# Patient Record
Sex: Female | Born: 1965 | Race: Black or African American | Hispanic: No | Marital: Single | Smoking: Former smoker
Health system: Southern US, Community
[De-identification: ages and names within clinical notes are randomized; demographics above are authoritative.]

## PROBLEM LIST (undated history)

## (undated) DIAGNOSIS — J45909 Unspecified asthma, uncomplicated: Secondary | ICD-10-CM

## (undated) DIAGNOSIS — G5603 Carpal tunnel syndrome, bilateral upper limbs: Secondary | ICD-10-CM

## (undated) HISTORY — DX: Carpal tunnel syndrome, bilateral upper limbs: G56.03

## (undated) HISTORY — PX: TUBAL LIGATION: SHX77

## (undated) HISTORY — DX: Unspecified asthma, uncomplicated: J45.909

## (undated) HISTORY — PX: NASAL FRACTURE SURGERY: SHX718

---

## 2001-08-03 ENCOUNTER — Emergency Department (HOSPITAL_COMMUNITY): Admission: EM | Admit: 2001-08-03 | Discharge: 2001-08-03 | Payer: Self-pay | Admitting: Emergency Medicine

## 2003-11-21 ENCOUNTER — Emergency Department (HOSPITAL_COMMUNITY): Admission: EM | Admit: 2003-11-21 | Discharge: 2003-11-21 | Payer: Self-pay | Admitting: Emergency Medicine

## 2007-03-17 ENCOUNTER — Emergency Department (HOSPITAL_COMMUNITY): Admission: EM | Admit: 2007-03-17 | Discharge: 2007-03-17 | Payer: Self-pay | Admitting: Emergency Medicine

## 2007-07-22 ENCOUNTER — Ambulatory Visit: Payer: Self-pay | Admitting: Internal Medicine

## 2007-08-27 ENCOUNTER — Ambulatory Visit: Payer: Self-pay | Admitting: Family Medicine

## 2007-08-27 LAB — CONVERTED CEMR LAB
ALT: 18 units/L (ref 0–35)
AST: 15 units/L (ref 0–37)
Albumin: 4.1 g/dL (ref 3.5–5.2)
Alkaline Phosphatase: 41 units/L (ref 39–117)
BUN: 9 mg/dL (ref 6–23)
Basophils Absolute: 0 10*3/uL (ref 0.0–0.1)
Basophils Relative: 0 % (ref 0–1)
CO2: 21 meq/L (ref 19–32)
Calcium: 9.1 mg/dL (ref 8.4–10.5)
Chloride: 107 meq/L (ref 96–112)
Cholesterol: 204 mg/dL — ABNORMAL HIGH (ref 0–200)
Creatinine, Ser: 0.81 mg/dL (ref 0.40–1.20)
Eosinophils Absolute: 0.2 10*3/uL (ref 0.0–0.7)
Eosinophils Relative: 4 % (ref 0–5)
Glucose, Bld: 98 mg/dL (ref 70–99)
HCT: 39.5 % (ref 36.0–46.0)
HDL: 43 mg/dL (ref >39)
Hemoglobin: 13.3 g/dL (ref 12.0–15.0)
LDL Cholesterol: 139 mg/dL — ABNORMAL HIGH (ref 0–99)
Lymphocytes Relative: 42 % (ref 12–46)
Lymphs Abs: 2.6 10*3/uL (ref 0.7–4.0)
MCHC: 33.7 g/dL (ref 30.0–36.0)
MCV: 94 fL (ref 78.0–100.0)
Monocytes Absolute: 0.6 10*3/uL (ref 0.1–1.0)
Monocytes Relative: 10 % (ref 3–12)
Neutro Abs: 2.6 10*3/uL (ref 1.7–7.7)
Neutrophils Relative %: 44 % (ref 43–77)
Platelets: 348 10*3/uL (ref 150–400)
Potassium: 4.3 meq/L (ref 3.5–5.3)
RBC: 4.2 M/uL (ref 3.87–5.11)
RDW: 12.9 % (ref 11.5–15.5)
Sodium: 138 meq/L (ref 135–145)
TSH: 1.126 microintl units/mL (ref 0.350–4.50)
Total Bilirubin: 0.4 mg/dL (ref 0.3–1.2)
Total CHOL/HDL Ratio: 4.7
Total Protein: 7 g/dL (ref 6.0–8.3)
Triglycerides: 110 mg/dL (ref <150)
VLDL: 22 mg/dL (ref 0–40)
WBC: 6 10*3/uL (ref 4.0–10.5)

## 2007-09-02 ENCOUNTER — Encounter: Payer: Self-pay | Admitting: Family Medicine

## 2007-09-02 ENCOUNTER — Ambulatory Visit: Payer: Self-pay | Admitting: Family Medicine

## 2007-09-02 LAB — CONVERTED CEMR LAB
Chlamydia, DNA Probe: NEGATIVE
GC Probe Amp, Genital: NEGATIVE

## 2007-09-03 ENCOUNTER — Ambulatory Visit: Payer: Self-pay | Admitting: *Deleted

## 2007-09-05 ENCOUNTER — Ambulatory Visit (HOSPITAL_COMMUNITY): Admission: RE | Admit: 2007-09-05 | Discharge: 2007-09-05 | Payer: Self-pay | Admitting: Family Medicine

## 2007-12-03 ENCOUNTER — Ambulatory Visit: Payer: Self-pay | Admitting: Family Medicine

## 2011-06-08 ENCOUNTER — Ambulatory Visit: Payer: Self-pay | Admitting: Family Medicine

## 2011-06-08 VITALS — BP 127/84 | HR 75 | Temp 98.3°F | Resp 16 | Ht 64.0 in | Wt 207.0 lb

## 2011-06-08 DIAGNOSIS — B9789 Other viral agents as the cause of diseases classified elsewhere: Secondary | ICD-10-CM

## 2011-06-08 DIAGNOSIS — R42 Dizziness and giddiness: Secondary | ICD-10-CM

## 2011-06-08 DIAGNOSIS — R1110 Vomiting, unspecified: Secondary | ICD-10-CM

## 2011-06-08 DIAGNOSIS — B349 Viral infection, unspecified: Secondary | ICD-10-CM

## 2011-06-08 LAB — POCT CBC
Granulocyte percent: 50.2 %G (ref 37–80)
HCT, POC: 40 % (ref 37.7–47.9)
Hemoglobin: 12.8 g/dL (ref 12.2–16.2)
Lymph, poc: 2.8 (ref 0.6–3.4)
MCH, POC: 30.3 pg (ref 27–31.2)
MCHC: 32 g/dL (ref 31.8–35.4)
MCV: 93.8 fL (ref 80–97)
MID (cbc): 0.6 (ref 0–0.9)
MPV: 7.5 fL (ref 0–99.8)
POC Granulocyte: 3.4 (ref 2–6.9)
POC LYMPH PERCENT: 41.5 %L (ref 10–50)
POC MID %: 8.3 %M (ref 0–12)
Platelet Count, POC: 378 10*3/uL (ref 142–424)
RBC: 4.26 M/uL (ref 4.04–5.48)
RDW, POC: 13.3 %
WBC: 6.8 10*3/uL (ref 4.6–10.2)

## 2011-06-08 MED ORDER — ONDANSETRON 4 MG PO TBDP: 4.0000 mg | ORAL_TABLET | Freq: Three times a day (TID) | ORAL | Status: AC | PRN

## 2011-06-08 NOTE — Progress Notes (Signed)
Subjective: 46 year old Afro-American female with a history of having been under the weather for the past 4 days. It started with a cough and head congestion. She got sore throat. Today she is nauseated and vomited once. She had a loose stool. She feels full, congested, and dizzy centered in her facial area. He generally is a healthy person. She is now on a regular medications. She's allergic to any medications. She has not had any major cardiac disease.  Objective: Pleasant female alert and oriented but looks like she doesn't feel very energetic today. Her TMs are normal. Nose is red and more indolent right nares than the left. Neck was supple without nodes. Throat clear without erythema. Chest clear to auscultation. Heart regular without any murmurs. Abdomen was soft without any masses. Does have a little epigastric tenderness.  Assessment: Generalized malaise with nausea, vomiting, dizziness, congestion  Plan:  Check CBC  Results for orders placed in visit on 06/08/11  POCT CBC      Component Value Range   WBC 6.8  4.6 - 10.2 (K/uL)   Lymph, poc 2.8  0.6 - 3.4    POC LYMPH PERCENT 41.5  10 - 50 (%L)   MID (cbc) 0.6  0 - 0.9    POC MID % 8.3  0 - 12 (%M)   POC Granulocyte 3.4  2 - 6.9    Granulocyte percent 50.2  37 - 80 (%G)   RBC 4.26  4.04 - 5.48 (M/uL)   Hemoglobin 12.8  12.2 - 16.2 (g/dL)   HCT, POC 14.7  82.9 - 47.9 (%)   MCV 93.8  80 - 97 (fL)   MCH, POC 30.3  27 - 31.2 (pg)   MCHC 32.0  31.8 - 35.4 (g/dL)   RDW, POC 56.2     Platelet Count, POC 378  142 - 424 (K/uL)   MPV 7.5  0 - 99.8 (fL)   Viral syndrome. Probably we could treat her symptomatically. She did not want any medicine but I'm going to have given her a prescription for Zofran to have on hand in case she needs it. Return if worse. Stay off work through tomorrow

## 2011-06-08 NOTE — Patient Instructions (Signed)
Fluids, rest, medicine if needed for nausea, return as necessary.

## 2013-03-13 ENCOUNTER — Encounter (HOSPITAL_COMMUNITY): Payer: Self-pay | Admitting: Emergency Medicine

## 2013-03-13 ENCOUNTER — Emergency Department (HOSPITAL_COMMUNITY)
Admission: EM | Admit: 2013-03-13 | Discharge: 2013-03-14 | Disposition: A | Discharge status: Home / Self Care | Payer: Self-pay | Attending: Emergency Medicine | Admitting: Emergency Medicine

## 2013-03-13 DIAGNOSIS — Z23 Encounter for immunization: Secondary | ICD-10-CM | POA: Insufficient documentation

## 2013-03-13 DIAGNOSIS — N751 Abscess of Bartholin's gland: Secondary | ICD-10-CM | POA: Insufficient documentation

## 2013-03-13 DIAGNOSIS — F17200 Nicotine dependence, unspecified, uncomplicated: Secondary | ICD-10-CM | POA: Insufficient documentation

## 2013-03-13 NOTE — ED Notes (Signed)
Pt. reports abscess at right vagina with no drainage onset yesterday .

## 2013-03-14 MED ORDER — TRAMADOL HCL 50 MG PO TABS: 50.0000 mg | ORAL_TABLET | Freq: Four times a day (QID) | ORAL | Status: DC | PRN

## 2013-03-14 MED ORDER — TETANUS-DIPHTH-ACELL PERTUSSIS 5-2.5-18.5 LF-MCG/0.5 IM SUSP
0.5000 mL | Freq: Once | INTRAMUSCULAR | Status: AC
  Administered 2013-03-14: 0.5 mL via INTRAMUSCULAR
  Filled 2013-03-14: qty 0.5

## 2013-03-14 NOTE — ED Notes (Signed)
Patient is resting comfortably. 

## 2013-03-14 NOTE — ED Provider Notes (Signed)
   INCISION AND DRAINAGE Date/Time: 03/14/2013 1:07 AM Performed by: Abigail Butts Authorized by: Abigail Butts Consent: Verbal consent obtained. Risks and benefits: risks, benefits and alternatives were discussed Consent given by: patient Patient understanding: patient states understanding of the procedure being performed Patient consent: the patient's understanding of the procedure matches consent given Procedure consent: procedure consent matches procedure scheduled Relevant documents: relevant documents present and verified Site marked: the operative site was marked Required items: required blood products, implants, devices, and special equipment available Patient identity confirmed: verbally with patient and arm band Time out: Immediately prior to procedure a "time out" was called to verify the correct patient, procedure, equipment, support staff and site/side marked as required. Type: abscess Body area: anogenital Location details: Bartholin's gland Anesthesia: local infiltration Local anesthetic: lidocaine 2% without epinephrine Anesthetic total: 3 ml Patient sedated: no Scalpel size: 11 Incision type: single straight Complexity: simple Drainage: purulent Drainage amount: copious Wound treatment: wound left open Packing material: 1/4 in iodoform gauze Patient tolerance: Patient tolerated the procedure well with no immediate complications.   Cristina Soho Dunbar Buras, PA-C 03/14/13 [phone]

## 2013-03-14 NOTE — ED Provider Notes (Signed)
CSN: [id number]     Arrival date & time 03/13/13  2123 History   First MD Initiated Contact with Patient 03/14/13 0006     Chief Complaint  Patient presents with  . Abscess   (Consider location/radiation/quality/duration/timing/severity/associated sxs/prior Treatment) HPI Is a generally healthy 48 year old man who presents with complaint of a "boil" over her right vulva. Noticed first a small bump about 5d ago. Lesion has increased in size. It is tender. Patient has 8/10 pain which is worse with walking or any pressure on the lesion. Pain is nonradiating. She denies fever. Denies systemic symptoms. History of similar symptoms. A history of abscess. Last tetanus is unknown.  History reviewed. No pertinent past medical history. History reviewed. No pertinent past surgical history. No family history on file. History  Substance Use Topics  . Smoking status: Current Every Day Smoker  . Smokeless tobacco: Not on file  . Alcohol Use: No   OB History   Grav Para Term Preterm Abortions TAB SAB Ect Mult Living                 Review of Systems Ten point review of symptoms performed and is negative with the exception of symptoms noted above.   Allergies  Review of patient's allergies indicates no known allergies.  Home Medications   Current Outpatient Rx  Name  Route  Sig  Dispense  Refill  . Ibuprofen-Diphenhydramine Cit (IBUPROFEN PM PO)   Oral   Take 1 tablet by mouth at bedtime as needed (pain).          BP 126/90  Pulse 91  Temp(Src) 98.1 F (36.7 C) (Oral)  Resp 18  Ht 5\' 3"  (1.6 m)  Wt 203 lb (92.08 kg)  BMI 35.97 kg/m2  SpO2 100%  LMP 03/06/2013 Physical Exam Gen: well developed and well nourished appearing Head: NCAT Eyes: PERL, EOMI Nose: no epistaixis or rhinorrhea Mouth/throat: mucosa is moist and pink Neck: supple, no stridor Lungs: CTA B, no wheezing, rhonchi or rales CV: RRR, no murmur, extremities appear well perfused.  Abd: soft, notender,  nondistended GU: right sided bartholin's abscess, mild tender right inguinal adenopaty Back: no ttp, no cva ttp Skin: warm and dry Ext: normal to inspection, no dependent edema Neuro: CN ii-xii grossly intact, no focal deficits Psyche; normal affect,  calm and cooperative.   ED Course  Procedures (including critical care time) Labs Review   MDM  Bartholin's abscess. MLP to incise and drain. Plan to d/c home with pain control and outpatient f/u for wound check in 2 days along with return precautions.     Elyn Peers, MD 03/14/13 [phone]

## 2013-03-14 NOTE — ED Provider Notes (Signed)
I directly supervised H. Muthersbaugh, PA-C as she performed this incision and drainage.   Elyn Peers, MD 03/14/13 [phone]

## 2013-03-14 NOTE — ED Notes (Signed)
MD at bedside. 

## 2013-03-14 NOTE — Discharge Instructions (Signed)
Bartholin's Cyst or Abscess Bartholin's glands are small glands located within the folds of skin (labia) along the sides of the lower opening of the vagina (birth canal). A cyst may develop when the duct of the gland becomes blocked. When this happens, fluid that accumulates within the cyst can become infected. This is known as an abscess. The Bartholin gland produces a mucous fluid to lubricate the outside of the vagina during sexual intercourse. SYMPTOMS   Patients with a small cyst may not have any symptoms.  Mild discomfort to severe pain depending on the size of the cyst and if it is infected (abscess).  Pain, redness, and swelling around the lower opening of the vagina.  Painful intercourse.  Pressure in the perineal area.  Swelling of the lips of the vagina (labia).  The cyst or abscess can be on one side or both sides of the vagina. DIAGNOSIS   A large swelling is seen in the lower vagina area by your caregiver.  Painful to touch.  Redness and pain, if it is an abscess. TREATMENT   Sometimes the cyst will go away on its own.  Apply warm wet compresses to the area or take hot sitz baths several times a day.  An incision to drain the cyst or abscess with local anesthesia.  Culture the pus, if it is an abscess.  Antibiotic treatment, if it is an abscess.  Cut open the gland and suture the edges to make the opening of the gland bigger (marsupialization).  Remove the whole gland if the cyst or abscess returns. PREVENTION   Practice good hygiene.  Clean the vaginal area with a mild soap and soft cloth when bathing.  Do not rub hard in the vaginal area when bathing.  Protect the crotch area with a padded cushion if you take long bike rides or ride horses.  Be sure you are well lubricated when you have sexual intercourse. HOME CARE INSTRUCTIONS   If your cyst or abscess was opened, a small piece of gauze, or a drain, may have been placed in the wound to allow  drainage. Do not remove this gauze or drain unless directed by your caregiver.  Wear feminine pads, not tampons, as needed for any drainage or bleeding.  If antibiotics were prescribed, take them exactly as directed. Finish the entire course.  Only take over-the-counter or prescription medicines for pain, discomfort, or fever as directed by your caregiver. SEEK IMMEDIATE MEDICAL CARE IF:   You have an increase in pain, redness, swelling, or drainage.  You have bleeding from the wound which results in the use of more than the number of pads suggested by your caregiver in 24 hours.  You have chills.  You have a fever.  You develop any new problems (symptoms) or aggravation of your existing condition. MAKE SURE YOU:   Understand these instructions.  Will watch your condition.  Will get help right away if you are not doing well or get worse. Document Released: 01/23/2005 Document Revised: 04/17/2011 Document Reviewed: 09/11/2007 Exit[employer] Patient Information 2014 [employer].  

## 2013-03-14 NOTE — ED Notes (Signed)
Pt has hair follicle that has developed into a abscess on her right labia of her vagina

## 2016-07-06 ENCOUNTER — Encounter (HOSPITAL_COMMUNITY): Payer: Self-pay | Admitting: Emergency Medicine

## 2016-07-06 ENCOUNTER — Emergency Department (HOSPITAL_COMMUNITY)
Admission: EM | Admit: 2016-07-06 | Discharge: 2016-07-06 | Disposition: A | Discharge status: Home / Self Care | Payer: Managed Care, Other (non HMO) | Attending: Emergency Medicine | Admitting: Emergency Medicine

## 2016-07-06 ENCOUNTER — Emergency Department (HOSPITAL_COMMUNITY): Payer: Managed Care, Other (non HMO)

## 2016-07-06 DIAGNOSIS — K529 Noninfective gastroenteritis and colitis, unspecified: Secondary | ICD-10-CM | POA: Insufficient documentation

## 2016-07-06 DIAGNOSIS — R1013 Epigastric pain: Secondary | ICD-10-CM | POA: Diagnosis present

## 2016-07-06 DIAGNOSIS — Z7982 Long term (current) use of aspirin: Secondary | ICD-10-CM | POA: Insufficient documentation

## 2016-07-06 DIAGNOSIS — M5412 Radiculopathy, cervical region: Secondary | ICD-10-CM | POA: Diagnosis not present

## 2016-07-06 DIAGNOSIS — F17200 Nicotine dependence, unspecified, uncomplicated: Secondary | ICD-10-CM | POA: Diagnosis not present

## 2016-07-06 DIAGNOSIS — A084 Viral intestinal infection, unspecified: Secondary | ICD-10-CM

## 2016-07-06 LAB — CBC WITH DIFFERENTIAL/PLATELET
Basophils Absolute: 0 10*3/uL (ref 0.0–0.1)
Basophils Relative: 1 %
Eosinophils Absolute: 0.5 10*3/uL (ref 0.0–0.7)
Eosinophils Relative: 9 %
HCT: 38 % (ref 36.0–46.0)
Hemoglobin: 12.9 g/dL (ref 12.0–15.0)
Lymphocytes Relative: 34 %
Lymphs Abs: 1.9 10*3/uL (ref 0.7–4.0)
MCH: 31.3 pg (ref 26.0–34.0)
MCHC: 33.9 g/dL (ref 30.0–36.0)
MCV: 92.2 fL (ref 78.0–100.0)
Monocytes Absolute: 0.4 10*3/uL (ref 0.1–1.0)
Monocytes Relative: 8 %
Neutro Abs: 2.8 10*3/uL (ref 1.7–7.7)
Neutrophils Relative %: 49 %
Platelets: 315 10*3/uL (ref 150–400)
RBC: 4.12 MIL/uL (ref 3.87–5.11)
RDW: 12.7 % (ref 11.5–15.5)
WBC: 5.6 10*3/uL (ref 4.0–10.5)

## 2016-07-06 LAB — HEPATIC FUNCTION PANEL
ALT: 18 U/L (ref 14–54)
AST: 16 U/L (ref 15–41)
Albumin: 3.7 g/dL (ref 3.5–5.0)
Alkaline Phosphatase: 43 U/L (ref 38–126)
BILIRUBIN TOTAL: 0.3 mg/dL (ref 0.3–1.2)
Total Protein: 7.3 g/dL (ref 6.5–8.1)

## 2016-07-06 LAB — BASIC METABOLIC PANEL
Anion gap: 4 — ABNORMAL LOW (ref 5–15)
BUN: 10 mg/dL (ref 6–20)
CO2: 26 mmol/L (ref 22–32)
Calcium: 8.8 mg/dL — ABNORMAL LOW (ref 8.9–10.3)
Chloride: 110 mmol/L (ref 101–111)
Creatinine, Ser: 0.84 mg/dL (ref 0.44–1.00)
GFR calc Af Amer: >60 mL/min (ref >60)
GFR calc non Af Amer: >60 mL/min (ref >60)
Glucose, Bld: 95 mg/dL (ref 65–99)
Potassium: 4.3 mmol/L (ref 3.5–5.1)
Sodium: 140 mmol/L (ref 135–145)

## 2016-07-06 LAB — TROPONIN I
Troponin I: <0.03 ng/mL (ref <0.03)
Troponin I: <0.03 ng/mL (ref <0.03)

## 2016-07-06 LAB — LIPASE, BLOOD: Lipase: 24 U/L (ref 11–51)

## 2016-07-06 MED ORDER — CYCLOBENZAPRINE HCL 5 MG PO TABS: 5.0000 mg | ORAL_TABLET | Freq: Three times a day (TID) | ORAL | 0 refills | Status: DC | PRN

## 2016-07-06 MED ORDER — DIPHENOXYLATE-ATROPINE 2.5-0.025 MG PO TABS
2.0000 | ORAL_TABLET | Freq: Once | ORAL | Status: AC
  Administered 2016-07-06: 2 via ORAL
  Filled 2016-07-06: qty 2

## 2016-07-06 MED ORDER — DIPHENOXYLATE-ATROPINE 2.5-0.025 MG PO TABS: 1.0000 | ORAL_TABLET | Freq: Four times a day (QID) | ORAL | 0 refills | Status: DC | PRN

## 2016-07-06 MED ORDER — IBUPROFEN 400 MG PO TABS: 400.0000 mg | ORAL_TABLET | Freq: Four times a day (QID) | ORAL | 0 refills | Status: DC | PRN

## 2016-07-06 NOTE — ED Provider Notes (Signed)
[facility] DEPT Provider Note   CSN: [id number] Arrival date & time: 07/06/16  0844     History   Chief Complaint Chief Complaint  Patient presents with  . Numbness  . Chest Pain    HPI Cristina Harris is a 51 y.o. female presenting with waves of epigastric pain described as intermittent and sharp, accompanied by diarrhea.  She describes 3 acute waves of pain the first woke her from sleep around 2 am today, the last occurred while at work just prior to presenting here.  She denies radiation of pain and is currently not having the sharp pain described.  With the last episode she became diaphoretic, lightheaded and started having numbness in her left arm radiating down to her long, ring and pinky finger which persists.  She denies headache, focal weakness, difficulty with speech.  She does have a history of neck problems with known bone spurs, denies history of radicular pain.  She denies chest pain, shortness of breath, n/v, dysuria.  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  The history is provided by the patient.    History reviewed. No pertinent past medical history.  There are no active problems to display for this patient.   Past Surgical History:  Procedure Laterality Date  . NASAL FRACTURE SURGERY    . TUBAL LIGATION      OB History    No data available       Home Medications    Prior to Admission medications   Medication Sig Start Date End Date Taking? Authorizing Provider  ASPIRIN-ACETAMINOPHEN PO Take 2 tablets by mouth daily as needed (pain).   Yes [provider]  cyclobenzaprine (FLEXERIL) 5 MG tablet Take 1 tablet (5 mg total) by mouth 3 (three) times daily as needed for muscle spasms. 07/06/16   Evalee Jefferson, PA-C  diphenoxylate-atropine (LOMOTIL) 2.5-0.025 MG tablet Take 1 tablet by mouth 4 (four) times daily as needed for diarrhea or loose stools (or abdominal cramping). 07/06/16   Evalee Jefferson, PA-C    ibuprofen (ADVIL,MOTRIN) 400 MG tablet Take 1 tablet (400 mg total) by mouth every 6 (six) hours as needed. 07/06/16   Evalee Jefferson, PA-C    Family History History reviewed. No pertinent family history.  Social History Social History  Substance Use Topics  . Smoking status: Current Every Day Smoker  . Smokeless tobacco: Never Used  . Alcohol use Yes     Comment: occ     Allergies   Patient has no known allergies.   Review of Systems Review of Systems  Constitutional: Positive for diaphoresis. Negative for fever.  HENT: Negative for congestion and sore throat.   Eyes: Negative.   Respiratory: Negative for chest tightness and shortness of breath.   Cardiovascular: Negative for chest pain.  Gastrointestinal: Positive for abdominal pain and diarrhea. Negative for nausea and vomiting.  Genitourinary: Negative.   Musculoskeletal: Negative for arthralgias, joint swelling and neck pain.  Skin: Negative.  Negative for rash and wound.  Neurological: Positive for light-headedness and numbness. Negative for dizziness, speech difficulty, weakness and headaches.  Psychiatric/Behavioral: Negative.      Physical Exam Updated Vital Signs BP (!) 128/93 (BP Location: Left Arm)   Pulse (!) 59   Temp 98 F (36.7 C) (Oral)   Resp 18   Wt 92.1 kg (203 lb)   LMP 03/06/2013   SpO2 100%   BMI 35.96 kg/m   Physical Exam  Constitutional: She  appears well-developed and well-nourished.  HENT:  Head: Normocephalic and atraumatic.  Eyes: Conjunctivae are normal.  Neck: Normal range of motion.  Cardiovascular: Normal rate, regular rhythm, normal heart sounds and intact distal pulses.   Pulmonary/Chest: Effort normal and breath sounds normal. She has no wheezes.  Abdominal: Soft. Bowel sounds are normal. She exhibits no mass. There is tenderness in the epigastric area. There is no guarding and negative Murphy's sign.  Musculoskeletal: Normal range of motion.  Neurological: She is alert. A  sensory deficit is present. No cranial nerve deficit. Coordination and gait normal.  Reflex Scores:      Bicep reflexes are 2+ on the right side and 2+ on the left side. Reports tingling sensation left arm and hand including long, ring and little finger.  Intact 2 point discrimination.  Equal grip strength and full, flex/ext of wrist and elbow 5/5.   Negative pronator drift. Lower extremities 5/5 strength ext/flex hip knee and ankle. Cranial nerves 3-12 intact.  Skin: Skin is warm and dry.  Psychiatric: She has a normal mood and affect.  Nursing note and vitals reviewed.    ED Treatments / Results  Labs (all labs ordered are listed, but only abnormal results are displayed) Results for orders placed or performed during the hospital encounter of 76/28/31  Basic metabolic panel  Result Value Ref Range   Sodium 140 135 - 145 mmol/L   Potassium 4.3 3.5 - 5.1 mmol/L   Chloride 110 101 - 111 mmol/L   CO2 26 22 - 32 mmol/L   Glucose, Bld 95 65 - 99 mg/dL   BUN 10 6 - 20 mg/dL   Creatinine, Ser 0.84 0.44 - 1.00 mg/dL   Calcium 8.8 (L) 8.9 - 10.3 mg/dL   GFR calc non Af Amer >60 >60 mL/min   GFR calc Af Amer >60 >60 mL/min   Anion gap 4 (L) 5 - 15  CBC with Differential  Result Value Ref Range   WBC 5.6 4.0 - 10.5 K/uL   RBC 4.12 3.87 - 5.11 MIL/uL   Hemoglobin 12.9 12.0 - 15.0 g/dL   HCT 38.0 36.0 - 46.0 %   MCV 92.2 78.0 - 100.0 fL   MCH 31.3 26.0 - 34.0 pg   MCHC 33.9 30.0 - 36.0 g/dL   RDW 12.7 11.5 - 15.5 %   Platelets 315 150 - 400 K/uL   Neutrophils Relative % 49 %   Neutro Abs 2.8 1.7 - 7.7 K/uL   Lymphocytes Relative 34 %   Lymphs Abs 1.9 0.7 - 4.0 K/uL   Monocytes Relative 8 %   Monocytes Absolute 0.4 0.1 - 1.0 K/uL   Eosinophils Relative 9 %   Eosinophils Absolute 0.5 0.0 - 0.7 K/uL   Basophils Relative 1 %   Basophils Absolute 0.0 0.0 - 0.1 K/uL  Troponin I  Result Value Ref Range   Troponin I <0.03 <0.03 ng/mL  Hepatic function panel  Result Value Ref Range    Total Protein 7.3 6.5 - 8.1 g/dL   Albumin 3.7 3.5 - 5.0 g/dL   AST 16 15 - 41 U/L   ALT 18 14 - 54 U/L   Alkaline Phosphatase 43 38 - 126 U/L   Total Bilirubin 0.3 0.3 - 1.2 mg/dL   Bilirubin, Direct <0.1 (L) 0.1 - 0.5 mg/dL   Indirect Bilirubin NOT CALCULATED 0.3 - 0.9 mg/dL  Lipase, blood  Result Value Ref Range   Lipase 24 11 - 51 U/L  Troponin I  Result Value Ref Range   Troponin I <0.03 <0.03 ng/mL   Dg Chest 2 View  Result Date: 07/06/2016 CLINICAL DATA:  Mid chest pain radiating down the left arm. EXAM: CHEST  2 VIEW COMPARISON:  11/21/2003 FINDINGS: Normal heart size and mediastinal contours. No acute infiltrate or edema. No effusion or pneumothorax. No acute osseous findings. IMPRESSION: Negative chest. Electronically Signed   By: Monte Fantasia M.D.   On: 07/06/2016 09:52     EKG  EKG Interpretation  Date/Time:  Thursday Jul 06 2016 08:59:02 EDT Ventricular Rate:  82 PR Interval:    QRS Duration: 79 QT Interval:  371 QTC Calculation: 434 R Axis:   41 Text Interpretation:  Sinus rhythm Ventricular premature complex Borderline T abnormalities, diffuse leads No old tracing to compare Confirmed by Sherwood Gambler [phone]) on 07/06/2016 10:57:04 AM       Radiology Dg Chest 2 View  Result Date: 07/06/2016 CLINICAL DATA:  Mid chest pain radiating down the left arm. EXAM: CHEST  2 VIEW COMPARISON:  11/21/2003 FINDINGS: Normal heart size and mediastinal contours. No acute infiltrate or edema. No effusion or pneumothorax. No acute osseous findings. IMPRESSION: Negative chest. Electronically Signed   By: Monte Fantasia M.D.   On: 07/06/2016 09:52    Procedures Procedures (including critical care time)  Medications Ordered in ED Medications  diphenoxylate-atropine (LOMOTIL) 2.5-0.025 MG per tablet 2 tablet (2 tablets Oral Given 07/06/16 1046)     Initial Impression / Assessment and Plan / ED Course  I have reviewed the triage vital signs and the nursing  notes.  Pertinent labs & imaging results that were available during my care of the patient were reviewed by me and considered in my medical decision making (see chart for details).     Pt was given lomotil with complete resolution of abdominal pain.  No diarrhea while here. She was given script for lomotil, ibuprofen and flexeril, suspect tingling in arm may be cervical radiculopathy given that she does have a history neck pain, ulnar distribution, no weakness on exam. 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  Delta trops negative.  Advised recheck here for any worsened sx. Referrals given to establish pcp.  Final Clinical Impressions(s) / ED Diagnoses   Final diagnoses:  Viral gastroenteritis  Cervical radiculopathy    New Prescriptions Discharge Medication List as of 07/06/2016  1:58 PM    START taking these medications   Details  cyclobenzaprine (FLEXERIL) 5 MG tablet Take 1 tablet (5 mg total) by mouth 3 (three) times daily as needed for muscle spasms., Starting Thu 07/06/2016, Print    diphenoxylate-atropine (LOMOTIL) 2.5-0.025 MG tablet Take 1 tablet by mouth 4 (four) times daily as needed for diarrhea or loose stools (or abdominal cramping)., Starting Thu 07/06/2016, Print    ibuprofen (ADVIL,MOTRIN) 400 MG tablet Take 1 tablet (400 mg total) by mouth every 6 (six) hours as needed., Starting Thu 07/06/2016, Print         Evalee Jefferson, PA-C 07/06/16 [place], MD 07/13/16 [phone]

## 2016-07-06 NOTE — ED Triage Notes (Signed)
Pt c/o sudden mid chest pain that is sharp radiating down left arm with last 3 fingers numb. Denies sob/dizziness/n/v. Diarrhea x 2 hrs. Nad. Mm wet. Pt states has been sweaty. nondiaphoretic at this time.

## 2016-07-06 NOTE — Discharge Instructions (Signed)
Use the lomotil prescribed only if you continue to have diarrhea.  Refer to the food choices list for ideas on resolving your diarrhea. You have been prescribed ibuprofen and a mild muscle relaxer which may help relieve the tingling you are feeling in your arm, as I suspect this is radiating from a pinched nerve in your neck.  You may also apply a heating pad to your neck several times daily which may also help relieve this symptom.  You will need further tests if your symptoms persist.  See the referral suggestions above for obtaining a primary doctor.

## 2019-09-26 DIAGNOSIS — M24131 Other articular cartilage disorders, right wrist: Secondary | ICD-10-CM | POA: Insufficient documentation

## 2019-09-26 DIAGNOSIS — M25531 Pain in right wrist: Secondary | ICD-10-CM | POA: Insufficient documentation

## 2019-09-30 ENCOUNTER — Other Ambulatory Visit: Payer: Self-pay | Admitting: Orthopedic Surgery

## 2019-09-30 DIAGNOSIS — M24131 Other articular cartilage disorders, right wrist: Secondary | ICD-10-CM

## 2019-10-21 ENCOUNTER — Ambulatory Visit
Admission: RE | Admit: 2019-10-21 | Discharge: 2019-10-21 | Disposition: A | Discharge status: Home / Self Care | Payer: Managed Care, Other (non HMO) | Source: Ambulatory Visit | Attending: Orthopedic Surgery | Admitting: Orthopedic Surgery

## 2019-10-21 ENCOUNTER — Other Ambulatory Visit: Payer: Self-pay

## 2019-10-21 DIAGNOSIS — M24131 Other articular cartilage disorders, right wrist: Secondary | ICD-10-CM

## 2019-10-21 MED ORDER — IOPAMIDOL (ISOVUE-M 200) INJECTION 41%
2.0000 mL | Freq: Once | INTRAMUSCULAR | Status: AC
  Administered 2019-10-21: 16:00:00 2 mL via INTRA_ARTICULAR

## 2019-11-06 ENCOUNTER — Other Ambulatory Visit: Payer: Managed Care, Other (non HMO)

## 2020-02-07 HISTORY — PX: CARPAL TUNNEL RELEASE: SHX101

## 2020-02-07 HISTORY — PX: WRIST ARTHROSCOPY: SUR100

## 2020-04-21 DIAGNOSIS — G5603 Carpal tunnel syndrome, bilateral upper limbs: Secondary | ICD-10-CM | POA: Insufficient documentation

## 2020-05-25 DIAGNOSIS — I10 Essential (primary) hypertension: Secondary | ICD-10-CM | POA: Insufficient documentation

## 2021-07-08 ENCOUNTER — Telehealth: Payer: Self-pay | Admitting: *Deleted

## 2021-07-08 ENCOUNTER — Ambulatory Visit (INDEPENDENT_AMBULATORY_CARE_PROVIDER_SITE_OTHER): Payer: 59 | Admitting: Nurse Practitioner

## 2021-07-08 ENCOUNTER — Encounter: Payer: Self-pay | Admitting: Nurse Practitioner

## 2021-07-08 VITALS — BP 134/89 | HR 84 | Ht 64.0 in | Wt 224.0 lb

## 2021-07-08 DIAGNOSIS — Z1211 Encounter for screening for malignant neoplasm of colon: Secondary | ICD-10-CM

## 2021-07-08 DIAGNOSIS — J45909 Unspecified asthma, uncomplicated: Secondary | ICD-10-CM | POA: Insufficient documentation

## 2021-07-08 DIAGNOSIS — Z0000 Encounter for general adult medical examination without abnormal findings: Secondary | ICD-10-CM

## 2021-07-08 DIAGNOSIS — Z1231 Encounter for screening mammogram for malignant neoplasm of breast: Secondary | ICD-10-CM | POA: Diagnosis not present

## 2021-07-08 DIAGNOSIS — J4540 Moderate persistent asthma, uncomplicated: Secondary | ICD-10-CM

## 2021-07-08 DIAGNOSIS — Z0001 Encounter for general adult medical examination with abnormal findings: Secondary | ICD-10-CM

## 2021-07-08 DIAGNOSIS — F321 Major depressive disorder, single episode, moderate: Secondary | ICD-10-CM

## 2021-07-08 DIAGNOSIS — R030 Elevated blood-pressure reading, without diagnosis of hypertension: Secondary | ICD-10-CM

## 2021-07-08 DIAGNOSIS — E669 Obesity, unspecified: Secondary | ICD-10-CM | POA: Insufficient documentation

## 2021-07-08 DIAGNOSIS — E663 Overweight: Secondary | ICD-10-CM | POA: Insufficient documentation

## 2021-07-08 MED ORDER — BUDESONIDE-FORMOTEROL FUMARATE 80-4.5 MCG/ACT IN AERO: 2.0000 | INHALATION_SPRAY | Freq: Two times a day (BID) | RESPIRATORY_TRACT | 3 refills | Status: DC

## 2021-07-08 NOTE — Assessment & Plan Note (Addendum)
BP Readings from Last 3 Encounters:  07/08/21 134/89  07/06/16 (!) 128/93  03/14/13 126/90  DASH diet advised, patient encouraged to exercise at least 150 minutes weekly Continue to monitor blood pressure at home goal is BP of less than 140/90

## 2021-07-08 NOTE — Assessment & Plan Note (Addendum)
Chronic condition uncontrolled Currently on albuterol inhaler as needed She has quit smoking Start Symbicort 80-4.5 mcg inhaler, 2 puffs twice daily Continue albuterol inhaler as needed  patient encouraged not to go back to smoking cigarettes Follow-up in 3 months

## 2021-07-08 NOTE — Assessment & Plan Note (Addendum)
Wt Readings from Last 3 Encounters:  07/08/21 224 lb (101.6 kg)  07/06/16 203 lb (92.1 kg)  03/13/13 203 lb (92.1 kg)  Does not exercise currently stated she has been eating a lot no pain attention to household all fat intake Need to increase intake of whole food consisting mainly vegetables and protein less carbohydrate drinking at least 64 ounces of water daily engaging in regular moderate exercises at least 150 minutes weekly, importance of portion control also discussed Pharmacologic treatment for obesity discussed with patient states that she would like to lose weight naturally

## 2021-07-08 NOTE — Assessment & Plan Note (Addendum)
Annual exam as documented.  Counseling done include healthy lifestyle involving committing to 150 minutes of exercise per week, heart healthy diet, and attaining healthy weight. The importance of adequate sleep also discussed.  Regular use of seat belt and home safety were also discussed . Changes in health habits are decided on by patient with goals and time frames set for achieving them. Immunization and cancer screening  needs are specifically addressed at this visit.  Due for shingles vaccine, encouraged to get her shingles vaccine at the pharmacy .  Referral sent to GI for colon cancer screening, mammogram ordered

## 2021-07-08 NOTE — Assessment & Plan Note (Signed)
PHQ-9 score 16 Does not want medication for depression Patient referred to CCM for counseling Denies SI, HI

## 2021-07-08 NOTE — Chronic Care Management (AMB) (Signed)
  Care Management   Outreach Note  07/08/2021 Name: Cristina Harris MRN: [id number] DOB: 26-Dec-1965  Referred by: Renee Rival, FNP Reason for referral : Care Coordination (Initial outreach to schedule referral with SW)   An unsuccessful telephone outreach was attempted today. The patient was referred to the case management team for assistance with care management and care coordination.   Follow Up Plan:  A HIPAA compliant phone message was left for the patient providing contact information and requesting a return call.  The care management team will reach out to the patient again over the next 7 days.  If patient returns call to provider office, please advise to call [place]* at [phone].*  [facility]  Direct Dial: [phone]

## 2021-07-08 NOTE — Progress Notes (Signed)
Complete physical exam  Patient: Cristina Harris   DOB: 1966/01/18   56 y.o. Female  MRN: [id number]  Subjective:    Chief Complaint  Patient presents with   New Patient (Initial Visit)    np    Cristina Harris is a 56 y.o. female with past medical history of Turner's Syndrome, asthma who presents today to establish care and for a complete physical exam. She reports consuming a general diet. The patient does not participate in regular exercise at present. She generally feels fairly well. She reports sleeping poorly. She does have additional problems to discuss today.   Carpal tunnel syndrome .  Had a covered tunnel release surgery to her right hand ,currently undergoing occupational therapy ,reports that  her blood pressure was high during therapy today, deneis CP, dizziness, Sob , Edema .  Not on medication for hypertension.  Currently takes Tylenol 500 mg as needed for her right wrist pain.  Reports that her right hand is sometimes swelling and sometimes has tingling sensation but that her symptoms is well controlled.  Asthma .  Uncontrolled condition uses albuterol inhaler twice daily.  Complains intermittent cough, shortness of breath wheezing   Depression. She has been out of work for about a year, due to having surgery for her carpal tunnel syndrome,  worried about how to pay bills, states that she is on workers compensation,  Pt denies SI, HI Refused medication for depression, would like to talk to a therapist for counseling  Had normal Pap exam in February 2022 Due for shingles vaccine patient encouraged to get vaccine at her pharmacy Due for mammogram, colon cancer screening, referred sent today     Casie Sturgeon Most recent fall risk assessment:    07/08/2021    2:40 PM  Fall Risk   Falls in the past year? 0  Number falls in past yr: 0  Injury with Fall? 0  Risk for fall due to : No Fall Risks  Follow up Falls evaluation completed     Most recent depression  screenings:    07/08/2021    2:40 PM  PHQ 2/9 Scores  PHQ - 2 Score 4  PHQ- 9 Score 16        Patient Care Team: Donell Beers, FNP as PCP - General (Nurse Practitioner)   Outpatient Medications Prior to Visit  Medication Sig   albuterol (VENTOLIN HFA) 108 (90 Base) MCG/ACT inhaler SMARTSIG:2 Puff(s) By Mouth Twice Daily PRN   [DISCONTINUED] ASPIRIN-ACETAMINOPHEN PO Take 2 tablets by mouth daily as needed (pain).   [DISCONTINUED] cyclobenzaprine (FLEXERIL) 5 MG tablet Take 1 tablet (5 mg total) by mouth 3 (three) times daily as needed for muscle spasms. (Patient not taking: Reported on 07/08/2021)   [DISCONTINUED] diphenoxylate-atropine (LOMOTIL) 2.5-0.025 MG tablet Take 1 tablet by mouth 4 (four) times daily as needed for diarrhea or loose stools (or abdominal cramping). (Patient not taking: Reported on 07/08/2021)   [DISCONTINUED] ibuprofen (ADVIL,MOTRIN) 400 MG tablet Take 1 tablet (400 mg total) by mouth every 6 (six) hours as needed. (Patient not taking: Reported on 07/08/2021)   No facility-administered medications prior to visit.    Review of Systems  Constitutional: Negative.  Negative for chills, fever and weight loss.  HENT: Negative.  Negative for congestion, ear discharge, ear pain, hearing loss, nosebleeds and tinnitus.   Eyes: Negative.  Negative for blurred vision, double vision and photophobia.  Respiratory: Negative.  Negative for cough, hemoptysis, sputum production and wheezing.   Cardiovascular: Negative.  Negative for chest pain, palpitations, orthopnea, claudication and leg swelling.  Gastrointestinal: Negative.  Negative for abdominal pain, blood in stool, constipation, diarrhea, heartburn, melena, nausea and vomiting.  Genitourinary: Negative.  Negative for dysuria, frequency, hematuria and urgency.  Musculoskeletal:  Positive for joint pain. Negative for back pain, myalgias and neck pain.  Skin: Negative.  Negative for itching and rash.  Neurological:   Negative for dizziness, tingling, tremors, sensory change, loss of consciousness and headaches.  Endo/Heme/Allergies:  Negative for environmental allergies and polydipsia.  Psychiatric/Behavioral:  Positive for depression. Negative for hallucinations, substance abuse and suicidal ideas. The patient is not nervous/anxious.          Objective:     BP 134/89   Pulse 84   Ht 5\' 4"  (1.626 m)   Wt 224 lb (101.6 kg)   LMP 03/06/2013   SpO2 96%   BMI 38.45 kg/m    Physical Exam Vitals and nursing note reviewed. Exam conducted with a chaperone present.  Constitutional:      General: She is not in acute distress.    Appearance: She is obese. She is not ill-appearing, toxic-appearing or diaphoretic.  HENT:     Head: Normocephalic and atraumatic.     Right Ear: Tympanic membrane, ear canal and external ear normal. There is no impacted cerumen.     Left Ear: Tympanic membrane, ear canal and external ear normal. There is no impacted cerumen.     Nose: Nose normal. No congestion or rhinorrhea.     Mouth/Throat:     Mouth: Mucous membranes are moist.     Pharynx: Oropharynx is clear. No oropharyngeal exudate or posterior oropharyngeal erythema.  Eyes:     General: No scleral icterus.       Right eye: No discharge.        Left eye: No discharge.     Extraocular Movements: Extraocular movements intact.     Conjunctiva/sclera: Conjunctivae normal.     Pupils: Pupils are equal, round, and reactive to light.  Neck:     Vascular: No carotid bruit.  Cardiovascular:     Rate and Rhythm: Normal rate and regular rhythm.     Pulses: Normal pulses.     Heart sounds: Normal heart sounds. No murmur heard.   No friction rub. No gallop.  Pulmonary:     Effort: Pulmonary effort is normal. No respiratory distress.     Breath sounds: Normal breath sounds. No stridor. No wheezing, rhonchi or rales.  Chest:     Chest wall: No mass, lacerations, deformity, swelling, tenderness, crepitus or edema.   Breasts:    Tanner Score is 5.     Breasts are symmetrical.     Right: Normal. No swelling, bleeding, inverted nipple, mass, nipple discharge, skin change or tenderness.     Left: Normal. No swelling, bleeding, inverted nipple, mass, nipple discharge, skin change or tenderness.  Abdominal:     General: There is no distension.     Palpations: Abdomen is soft. There is no mass.     Tenderness: There is no abdominal tenderness. There is no right CVA tenderness, left CVA tenderness, guarding or rebound.     Hernia: No hernia is present.  Musculoskeletal:        General: No swelling, tenderness, deformity or signs of injury. Normal range of motion.     Cervical back: Normal range of motion and neck supple. No rigidity or tenderness.     Right lower leg: No edema.  Left lower leg: No edema.  Lymphadenopathy:     Cervical: No cervical adenopathy.     Upper Body:     Right upper body: No supraclavicular, axillary or pectoral adenopathy.     Left upper body: No supraclavicular or axillary adenopathy.  Skin:    General: Skin is warm and dry.     Capillary Refill: Capillary refill takes less than 2 seconds.     Coloration: Skin is not jaundiced or pale.     Findings: No bruising, erythema, lesion or rash.  Neurological:     Mental Status: She is alert and oriented to person, place, and time.     Cranial Nerves: No cranial nerve deficit.     Sensory: No sensory deficit.     Motor: No weakness.     Coordination: Coordination normal.     Gait: Gait normal.     Deep Tendon Reflexes: Reflexes normal.  Psychiatric:        Mood and Affect: Mood normal.        Behavior: Behavior normal.        Thought Content: Thought content normal.        Judgment: Judgment normal.     No results found for any visits on 07/08/21.     Assessment & Plan:    Routine Health Maintenance and Physical Exam  Immunization History  Administered Date(s) Administered   Tdap 03/14/2013    Health  Maintenance  Topic Date Due   Hepatitis C Screening  Never done   PAP SMEAR-Modifier  Never done   COLONOSCOPY (Pts 45-58yrs Insurance coverage will need to be confirmed)  Never done   MAMMOGRAM  02/10/2014   Zoster Vaccines- Shingrix (1 of 2) Never done   INFLUENZA VACCINE  09/06/2021   TETANUS/TDAP  12/23/2028   HIV Screening  Completed   HPV VACCINES  Aged Out   COVID-19 Vaccine  Discontinued    Discussed health benefits of physical activity, and encouraged her to engage in regular exercise appropriate for her age and condition.  Problem List Items Addressed This Visit       Respiratory   Asthma    Chronic condition uncontrolled Currently on albuterol inhaler as needed She has quit smoking Start Symbicort 80-4.5 mcg inhaler, 2 puffs twice daily Continue albuterol inhaler as needed  patient encouraged not to go back to smoking cigarettes Follow-up in 3 months       Relevant Medications   albuterol (VENTOLIN HFA) 108 (90 Base) MCG/ACT inhaler   budesonide-formoterol (SYMBICORT) 80-4.5 MCG/ACT inhaler     Other   Annual physical exam - Primary    Annual exam as documented.  Counseling done include healthy lifestyle involving committing to 150 minutes of exercise per week, heart healthy diet, and attaining healthy weight. The importance of adequate sleep also discussed.  Regular use of seat belt and home safety were also discussed . Changes in health habits are decided on by patient with goals and time frames set for achieving them. Immunization and cancer screening  needs are specifically addressed at this visit.  Due for shingles vaccine, encouraged to get her shingles vaccine at the pharmacy .  Referral sent to GI for colon cancer screening, mammogram ordered       Relevant Orders   CBC with Differential   Lipid Profile   HgB A1c   TSH   Vitamin D (25 hydroxy)   CMP14+EGFR   Hepatitis C Antibody   Elevated BP without diagnosis of hypertension  BP Readings  from Last 3 Encounters:  07/08/21 134/89  07/06/16 (!) 128/93  03/14/13 126/90  DASH diet advised, patient encouraged to exercise at least 150 minutes weekly Continue to monitor blood pressure at home goal is BP of less than 140/90      Obesity (BMI 30-39.9)    Wt Readings from Last 3 Encounters:  07/08/21 224 lb (101.6 kg)  07/06/16 203 lb (92.1 kg)  03/13/13 203 lb (92.1 kg)  Does not exercise currently stated she has been eating a lot no pain attention to household all fat intake Need to increase intake of whole food consisting mainly vegetables and protein less carbohydrate drinking at least 64 ounces of water daily engaging in regular moderate exercises at least 150 minutes weekly, importance of portion control also discussed Pharmacologic treatment for obesity discussed with patient states that she would like to lose weight naturally      Depression, major, single episode, moderate (HCC)    PHQ-9 score 16 Does not want medication for depression Patient referred to CCM for counseling Denies SI, HI       Relevant Orders   AMB Referral to Community Care Coordinaton   RESOLVED: Overweight   Other Visit Diagnoses     Screening for colon cancer       Relevant Orders   Ambulatory referral to Gastroenterology   Encounter for screening mammogram for malignant neoplasm of breast       Relevant Orders   MM 3D SCREEN BREAST BILATERAL      Return in about 1 year (around 07/09/2022) for CPE.     Renee Rival, FNP

## 2021-07-08 NOTE — Patient Instructions (Addendum)
Please get your shingles vaccine at your pharmacy   Symbicort inhaler . Inhale 2 puffs into the lungs 2 (two) times daily for your asthma, continue albuterol inhaler as needed,   Please schedule fasting labs done as discussed Please monitor your blood pressure at home goal is blood pressure of less than 140/90.  Please call the office if you have get any readings above this  It is important that you exercise regularly at least 30 minutes 5 times a week.  Think about what you will eat, plan ahead. Choose " clean, green, fresh or frozen" over canned, processed or packaged foods which are more sugary, salty and fatty. 70 to 75% of food eaten should be vegetables and fruit. Three meals at set times with snacks allowed between meals, but they must be fruit or vegetables. Aim to eat over a 12 hour period , example 7 am to 7 pm, and STOP after  your last meal of the day. Drink water,generally about 64 ounces per day, no other drink is as healthy. Fruit juice is best enjoyed in a healthy way, by EATING the fruit.  Thanks for choosing [facility], we consider it a privelige to serve you.

## 2021-07-11 NOTE — Chronic Care Management (AMB) (Signed)
  Care Management   Note  07/11/2021 Name: Cristina Harris MRN: [id number] DOB: 1965-02-15  Cristina Harris is a 56 y.o. year old female who is a primary care patient of Renee Rival, FNP. I reached out to [name] by phone today offer care coordination services.   [name] was given information about care management services today including:  Care management services include personalized support from designated clinical staff supervised by her physician, including individualized plan of care and coordination with other care providers 24/7 contact phone numbers for assistance for urgent and routine care needs. The patient may stop care management services at any time by phone call to the office staff.  Patient agreed to services and verbal consent obtained.   Follow up plan: Telephone appointment with care management team member scheduled for:07/14/21  [place] [facility]  Direct Dial: [phone]

## 2021-07-12 ENCOUNTER — Telehealth: Payer: Self-pay

## 2021-07-12 ENCOUNTER — Other Ambulatory Visit: Payer: Self-pay | Admitting: Nurse Practitioner

## 2021-07-12 ENCOUNTER — Encounter: Payer: Self-pay | Admitting: *Deleted

## 2021-07-12 DIAGNOSIS — E559 Vitamin D deficiency, unspecified: Secondary | ICD-10-CM

## 2021-07-12 LAB — TSH: TSH: 1 u[IU]/mL (ref 0.450–4.500)

## 2021-07-12 LAB — CBC WITH DIFFERENTIAL/PLATELET
Basophils Absolute: 0.1 10*3/uL (ref 0.0–0.2)
Basos: 1 %
EOS (ABSOLUTE): 0.9 10*3/uL — ABNORMAL HIGH (ref 0.0–0.4)
Eos: 16 %
Hematocrit: 37.6 % (ref 34.0–46.6)
Hemoglobin: 12.8 g/dL (ref 11.1–15.9)
Immature Grans (Abs): 0 10*3/uL (ref 0.0–0.1)
Immature Granulocytes: 1 %
Lymphocytes Absolute: 2.2 10*3/uL (ref 0.7–3.1)
Lymphs: 37 %
MCH: 30.7 pg (ref 26.6–33.0)
MCHC: 34 g/dL (ref 31.5–35.7)
MCV: 90 fL (ref 79–97)
Monocytes Absolute: 0.5 10*3/uL (ref 0.1–0.9)
Monocytes: 8 %
Neutrophils Absolute: 2.1 10*3/uL (ref 1.4–7.0)
Neutrophils: 37 %
Platelets: 374 10*3/uL (ref 150–450)
RBC: 4.17 x10E6/uL (ref 3.77–5.28)
RDW: 13 % (ref 11.7–15.4)
WBC: 5.9 10*3/uL (ref 3.4–10.8)

## 2021-07-12 LAB — CMP14+EGFR
ALT: 21 IU/L (ref 0–32)
AST: 13 IU/L (ref 0–40)
Albumin/Globulin Ratio: 1.2 (ref 1.2–2.2)
Albumin: 4.1 g/dL (ref 3.8–4.9)
Alkaline Phosphatase: 72 IU/L (ref 44–121)
BUN/Creatinine Ratio: 15 (ref 9–23)
BUN: 13 mg/dL (ref 6–24)
Bilirubin Total: 0.4 mg/dL (ref 0.0–1.2)
CO2: 24 mmol/L (ref 20–29)
Calcium: 9.3 mg/dL (ref 8.7–10.2)
Chloride: 107 mmol/L — ABNORMAL HIGH (ref 96–106)
Creatinine, Ser: 0.89 mg/dL (ref 0.57–1.00)
Globulin, Total: 3.5 g/dL (ref 1.5–4.5)
Glucose: 86 mg/dL (ref 70–99)
Potassium: 4.3 mmol/L (ref 3.5–5.2)
Sodium: 145 mmol/L — ABNORMAL HIGH (ref 134–144)
Total Protein: 7.6 g/dL (ref 6.0–8.5)
eGFR: 76 mL/min/{1.73_m2} (ref >59)

## 2021-07-12 LAB — HEPATITIS C ANTIBODY: Hep C Virus Ab: NONREACTIVE

## 2021-07-12 LAB — LIPID PANEL
Chol/HDL Ratio: 3.7 ratio (ref 0.0–4.4)
Cholesterol, Total: 220 mg/dL — ABNORMAL HIGH (ref 100–199)
HDL: 60 mg/dL (ref >39)
LDL Chol Calc (NIH): 148 mg/dL — ABNORMAL HIGH (ref 0–99)
Triglycerides: 71 mg/dL (ref 0–149)
VLDL Cholesterol Cal: 12 mg/dL (ref 5–40)

## 2021-07-12 LAB — HEMOGLOBIN A1C
Est. average glucose Bld gHb Est-mCnc: 111 mg/dL
Hgb A1c MFr Bld: 5.5 % (ref 4.8–5.6)

## 2021-07-12 LAB — VITAMIN D 25 HYDROXY (VIT D DEFICIENCY, FRACTURES): Vit D, 25-Hydroxy: 14 ng/mL — ABNORMAL LOW (ref 30.0–100.0)

## 2021-07-12 MED ORDER — VITAMIN D (ERGOCALCIFEROL) 1.25 MG (50000 UNIT) PO CAPS: 50000.0000 [IU] | ORAL_CAPSULE | ORAL | 0 refills | Status: DC

## 2021-07-12 NOTE — Addendum Note (Signed)
Addended by: Renee Rival on: 07/12/2021 03:06 PM   Modules accepted: Level of Service

## 2021-07-12 NOTE — Telephone Encounter (Signed)
Patient calling about blood work done yesterday, please return patient call.

## 2021-07-12 NOTE — Progress Notes (Signed)
Hyperlipidemia. Eat a healthy diet, including lots of fruits and vegetables. Avoid foods with a lot of saturated and trans fats, such as red meat, butter, fried foods and cheese . Maintain a healthy weight.  Vitamin D deff. Take vitamin D 50,000 unites once weekly for 8 weeks, after 8 weeks take vitamin D 1000 units daily.   Sodium level is slightly elevated, dink at least 64 ounces of water daily,   Other labs are normal

## 2021-07-13 NOTE — Telephone Encounter (Signed)
Spoke with pt she wants to see about being prescribed something for weightloss. Advised pt to see what they will cover and let us know pt verbalized understanding

## 2021-07-14 ENCOUNTER — Ambulatory Visit: Payer: 59

## 2021-07-14 DIAGNOSIS — R030 Elevated blood-pressure reading, without diagnosis of hypertension: Secondary | ICD-10-CM

## 2021-07-14 DIAGNOSIS — E669 Obesity, unspecified: Secondary | ICD-10-CM

## 2021-07-14 DIAGNOSIS — E559 Vitamin D deficiency, unspecified: Secondary | ICD-10-CM

## 2021-07-14 DIAGNOSIS — F321 Major depressive disorder, single episode, moderate: Secondary | ICD-10-CM

## 2021-07-14 DIAGNOSIS — J4540 Moderate persistent asthma, uncomplicated: Secondary | ICD-10-CM

## 2021-07-14 NOTE — Chronic Care Management (AMB) (Signed)
Care Management Clinical Social Work Note  07/14/2021 Name: Cristina Harris MRN: [id number] DOB: 23-Oct-1965  Cristina Harris is a 56 y.o. year old female who is a primary care patient of Renee Rival, FNP.  The Care Management team was consulted for assistance with chronic disease management and coordination needs.  Engaged with patient by telephone for initial visit in response to provider referral for social work chronic care management and care coordination services  Consent to Services:  Cristina Harris was given information about Care Management services today including:  Care Management services includes personalized support from designated clinical staff supervised by her physician, including individualized plan of care and coordination with other care providers 24/7 contact phone numbers for assistance for urgent and routine care needs. The patient may stop case management services at any time by phone call to the office staff.  Patient agreed to services and consent obtained.   Assessment: Review of patient past medical history, allergies, medications, and health status, including review of relevant consultants reports was performed today as part of a comprehensive evaluation and provision of chronic care management and care coordination services.  SDOH (Social Determinants of Health) assessments and interventions performed:  SDOH Interventions    Flowsheet Row Most Recent Value  SDOH Interventions   Stress Interventions Provide Counseling  Cristina Harris has stress related to finances and her job]  Depression Interventions/Treatment  Counseling        Advanced Directives Status: See Vynca application for related entries.  Care Plan  No Known Allergies  Outpatient Encounter Medications as of 07/14/2021  Medication Sig   albuterol (VENTOLIN HFA) 108 (90 Base) MCG/ACT inhaler SMARTSIG:2 Puff(s) By Mouth Twice Daily PRN   budesonide-formoterol (SYMBICORT) 80-4.5 MCG/ACT inhaler  Inhale 2 puffs into the lungs 2 (two) times daily.   Vitamin D, Ergocalciferol, (DRISDOL) 1.25 MG (50000 UNIT) CAPS capsule Take 1 capsule (50,000 Units total) by mouth every 7 (seven) days.   No facility-administered encounter medications on file as of 07/14/2021.    Patient Active Problem List   Diagnosis Date Noted   Annual physical exam 07/08/2021   Elevated BP without diagnosis of hypertension 07/08/2021   Obesity (BMI 30-39.9) 07/08/2021   Asthma 07/08/2021   Depression, major, single episode, moderate ([place]) 07/08/2021    Conditions to be addressed/monitored: monitor client management of depression issues  Care Plan : [place]  Updates made by Katha Cabal, LCSW since 07/14/2021 12:00 AM     Problem: Emotional Distress      Goal: Emotional Health Supported. manage anxiety issues. manage stress issues. manage financial issues   Start Date: 07/14/2021  Expected End Date: 09/29/2021  This Visit's Progress: Not on track  Priority: high   Note:   Current Barriers:   Financial challenges Anxiety issues Stress issues Concern over job status (receives [employer]) Suicidal Ideation/Homicidal Ideation: No  Clinical Social Work Goal(s):  patient will work with SW monthly by telephone or in person to reduce or manage symptoms related to anxiety and depression issues Patient will communicate in next 30 days as needed with [facility] for nursing support Patient will attend scheduled medical appointments in next 30 days  Interventions: Patient interviewed and appropriate assessments performed: [place] 2/9. GAD-7  1:1 collaboration with Renee Rival, FNP regarding development and update of comprehensive plan of care as evidenced by provider attestation and co-signature Discussed client needs with Cristina Harris Discussed sleeping challenges with Cristina Harris.  Client said she has difficulty sleeping Discussed financial needs of  client She receives Worker's Compensation  benefit monthly Reviewed transport needs of client. Reviewed appetite of client Reviewed mood of client. She is sad occasionally related to her financial needs and health needs Reviewed family support. She has support from 2 of her daughters and from her son Discussed asthma challenges. She said she uses an inhaler as needed to help with breathing Discussed job situation.She receives [employer].  She has hand and wrist pain in right hand.  Encouraged Cristina Harris to talk with RNCM as needed for nursing support Provided counseling support for client LCSW discussed with client the services of Care Management support Reviewed pain issues of client.   Patient Coping Strengths:  Family support Has no transport needs Attends medical appointments  Patient Self Care Deficits:  Anxiety issues Depression issues Financial issues  Patient Goals:  - spend time or talk with others at least 2 to 3 times per week - practice relaxation or meditation daily - keep a calendar with appointment dates  Follow Up Plan: LCSW to call client on 08/18/21 at 1:00 PM      Norva Riffle.Carron Mcmurry MSW, [place] [employer] representative [facility] Care Management [phone]

## 2021-07-14 NOTE — Patient Instructions (Signed)
Visit Information  Thank you for taking time to visit with me today. Please don't hesitate to contact me if I can be of assistance to you before our next scheduled telephone appointment.  Following are the goals we discussed today:   Our next appointment is by telephone on 08/18/21 at 1:00 PM   Please call the care guide team at [phone] if you need to cancel or reschedule your appointment.   If you are experiencing a Mental Health or [place] or need someone to talk to, please call the [facility]: [phone]   Following is a copy of your full plan of care:  Care Plan : [place]  Updates made by Katha Cabal, LCSW since 07/14/2021 12:00 AM     Problem: Emotional Distress      Goal: Emotional Health Supported. manage anxiety issues. manage stress issues. manage financial issues   Start Date: 07/14/2021  Expected End Date: 09/29/2021  This Visit's Progress: Not on track  Priority: high   Note:   Current Barriers:   Financial challenges Anxiety issues Stress issues Concern over job status (receives [employer]) Suicidal Ideation/Homicidal Ideation: No  Clinical Social Work Goal(s):  patient will work with SW monthly by telephone or in person to reduce or manage symptoms related to anxiety and depression issues Patient will communicate in next 30 days as needed with [facility] for nursing support Patient will attend scheduled medical appointments in next 30 days  Interventions: Patient interviewed and appropriate assessments performed: [place] 2/9. GAD-7  1:1 collaboration with Renee Rival, FNP regarding development and update of comprehensive plan of care as evidenced by provider attestation and co-signature Discussed client needs with [name] Discussed sleeping challenges with Tameika.  Client said she has difficulty sleeping Discussed financial needs of client She receives Worker's Compensation benefit  monthly Reviewed transport needs of client. Reviewed appetite of client Reviewed mood of client. She is sad occasionally related to her financial needs and health needs Reviewed family support. She has support from 2 of her daughters and from her son Discussed asthma challenges. She said she uses an inhaler as needed to help with breathing Discussed job situation.She receives [employer].  She has hand and wrist pain in right hand.  Encouraged Akina to talk with RNCM as needed for nursing support Provided counseling support for client LCSW discussed with client the services of Care Management support Reviewed pain issues of client.   Patient Coping Strengths:  Family support Has no transport needs Attends medical appointments  Patient Self Care Deficits:  Anxiety issues Depression issues Financial issues  Patient Goals:  - spend time or talk with others at least 2 to 3 times per week - practice relaxation or meditation daily - keep a calendar with appointment dates  Follow Up Plan: LCSW to call client on 08/18/21 at 1:00 PM      Ms. Zaun was given information about Care Management services by the embedded care coordination team including:  Care Management services include personalized support from designated clinical staff supervised by her physician, including individualized plan of care and coordination with other care providers 24/7 contact phone numbers for assistance for urgent and routine care needs. The patient may stop CCM services at any time (effective at the end of the month) by phone call to the office staff.  Patient agreed to services and verbal consent obtained.   Norva Riffle.Elijah Phommachanh MSW, [place] [employer] representative [facility] Care Management [phone]

## 2021-08-02 ENCOUNTER — Ambulatory Visit (INDEPENDENT_AMBULATORY_CARE_PROVIDER_SITE_OTHER): Payer: 59 | Admitting: Nurse Practitioner

## 2021-08-02 ENCOUNTER — Encounter: Payer: Self-pay | Admitting: Nurse Practitioner

## 2021-08-02 DIAGNOSIS — R030 Elevated blood-pressure reading, without diagnosis of hypertension: Secondary | ICD-10-CM

## 2021-08-02 DIAGNOSIS — G5603 Carpal tunnel syndrome, bilateral upper limbs: Secondary | ICD-10-CM | POA: Diagnosis not present

## 2021-08-02 DIAGNOSIS — E559 Vitamin D deficiency, unspecified: Secondary | ICD-10-CM | POA: Insufficient documentation

## 2021-08-02 MED ORDER — VITAMIN D (ERGOCALCIFEROL) 1.25 MG (50000 UNIT) PO CAPS: 50000.0000 [IU] | ORAL_CAPSULE | ORAL | 0 refills | Status: DC

## 2021-08-02 MED ORDER — IBUPROFEN 600 MG PO TABS: 600.0000 mg | ORAL_TABLET | Freq: Three times a day (TID) | ORAL | 0 refills | Status: DC | PRN

## 2021-08-02 NOTE — Assessment & Plan Note (Addendum)
Chronic condition Has history of right carpal tunnel release Continue Tylenol 500 mg every 6 hours as needed Patient encouraged to wear wrsit splints nightly as ordered Patient encouraged to maintain close follow-up with up with orthopedics

## 2021-08-12 ENCOUNTER — Ambulatory Visit: Payer: Managed Care, Other (non HMO) | Admitting: Nurse Practitioner

## 2021-08-18 ENCOUNTER — Telehealth: Payer: 59

## 2021-08-19 ENCOUNTER — Ambulatory Visit: Payer: 59 | Admitting: Licensed Clinical Social Worker

## 2021-08-19 DIAGNOSIS — J4540 Moderate persistent asthma, uncomplicated: Secondary | ICD-10-CM

## 2021-08-19 DIAGNOSIS — G5603 Carpal tunnel syndrome, bilateral upper limbs: Secondary | ICD-10-CM

## 2021-08-19 DIAGNOSIS — F321 Major depressive disorder, single episode, moderate: Secondary | ICD-10-CM

## 2021-08-19 DIAGNOSIS — E669 Obesity, unspecified: Secondary | ICD-10-CM

## 2021-08-19 DIAGNOSIS — E559 Vitamin D deficiency, unspecified: Secondary | ICD-10-CM

## 2021-08-19 NOTE — Chronic Care Management (AMB) (Signed)
Care Management Clinical Social Work Note  08/19/2021 Name: Cristina Harris MRN: [id number] DOB: 05/28/1965  Cristina Harris is a 56 y.o. year old female who is a primary care patient of Renee Rival, FNP.  The Care Management team was consulted for assistance with chronic disease management and coordination needs.  Engaged with patient by telephone for follow up visit in response to provider referral for social work chronic care management and care coordination services  Consent to Services:  [name] was given information about Care Management services today including:  Care Management services includes personalized support from designated clinical staff supervised by her physician, including individualized plan of care and coordination with other care providers 24/7 contact phone numbers for assistance for urgent and routine care needs. The patient may stop case management services at any time by phone call to the office staff.  Patient agreed to services and consent obtained.   Assessment: Review of patient past medical history, allergies, medications, and health status, including review of relevant consultants reports was performed today as part of a comprehensive evaluation and provision of chronic care management and care coordination services.  SDOH (Social Determinants of Health) assessments and interventions performed:  SDOH Interventions    Flowsheet Row Most Recent Value  SDOH Interventions   Stress Interventions Provide Counseling  [client has stress related to difficulty sleeping. stress related to finances.]  Depression Interventions/Treatment  Counseling        Advanced Directives Status: See Vynca application for related entries.  Care Plan  No Known Allergies  Outpatient Encounter Medications as of 08/19/2021  Medication Sig   acetaminophen (TYLENOL) 500 MG tablet Take by mouth.   albuterol (VENTOLIN HFA) 108 (90 Base) MCG/ACT inhaler SMARTSIG:2  Puff(s) By Mouth Twice Daily PRN (Patient not taking: Reported on 08/02/2021)   budesonide-formoterol (SYMBICORT) 80-4.5 MCG/ACT inhaler Inhale 2 puffs into the lungs 2 (two) times daily.   Vitamin D, Ergocalciferol, (DRISDOL) 1.25 MG (50000 UNIT) CAPS capsule Take 1 capsule (50,000 Units total) by mouth every 7 (seven) days.   No facility-administered encounter medications on file as of 08/19/2021.    Patient Active Problem List   Diagnosis Date Noted   Bilateral carpal tunnel syndrome 08/02/2021   Vitamin D deficiency 08/02/2021   Annual physical exam 07/08/2021   Elevated BP without diagnosis of hypertension 07/08/2021   Obesity (BMI 30-39.9) 07/08/2021   Asthma 07/08/2021   Depression, major, single episode, moderate ([place]) 07/08/2021    Conditions to be addressed/monitored: monitor client management of depression issues  Care Plan : [place]  Updates made by Katha Cabal, LCSW since 08/19/2021 12:00 AM     Problem: Emotional Distress      Goal: Emotional Health Supported. manage anxiety issues. manage stress issues. manage financial issues   Start Date: 07/14/2021  Expected End Date: 11/01/2021  This Visit's Progress: On track  Recent Progress: Not on track  Priority: Medium  Note:   Current Barriers:   Financial challenges Anxiety issues Stress issues Concern over job status (receives [employer], [employer]) Suicidal Ideation/Homicidal Ideation: No  Clinical Social Work Goal(s):  patient will work with SW monthly by telephone or in person to reduce or manage symptoms related to anxiety and depression issues Patient will communicate in next 30 days as needed with RNCM for nursing support Patient will attend scheduled medical appointments in next 30 days  Interventions: 1:1 collaboration with Renee Rival, FNP regarding development and update of comprehensive plan of care as evidenced by  provider attestation and co-signature Discussed client needs with  [name] Discussed sleeping challenges with [place].  Client said she has difficulty sleeping Discussed financial needs of client She receives Worker's Compensation benefit monthly Reviewed transport needs of client. She has a car which she drives as needed. Reviewed mood of client. She is sad occasionally related to her financial needs and health needs. LCSW talked with client about counseling support with LCSW.  LCSW talked with client about stress management techniques. She likes playing games on her phone to help her relax. She likes spending time with her children and friends as ways to help her relax.  LCSW talked with client about patient strengths. She is now in a new rental residence. Her car is paid for which is a strength. She has good family support from her children. She will begin to receive Food Stamps benefit in a few weeks.  Discussed asthma challenges. She said she uses an inhaler as needed to help with breathing Discussed job situation.She receives [employer].  She has hand and wrist pain in right hand.  Encouraged Hanae to talk with RNCM as needed for nursing support Provided counseling support for client LCSW discussed with client the services of Care Management support Reviewed pain issues of client.   Patient Coping Strengths:  Family support Has no transport needs Attends medical appointments  Patient Self Care Deficits:  Anxiety issues Depression issues Financial issues  Patient Goals:  - spend time or talk with others at least 2 to 3 times per week - practice relaxation or meditation daily - keep a calendar with appointment dates  Follow Up Plan: LCSW to call client on 09/30/21 at 3:00 PM      Norva Riffle.Syenna Nazir MSW, [place] Holiday representative [facility] Care Management [phone]

## 2021-08-19 NOTE — Patient Instructions (Signed)
Visit Information  Thank you for taking time to visit with me today. Please don't hesitate to contact me if I can be of assistance to you before our next scheduled telephone appointment.  Following are the goals we discussed today:   Our next appointment is by telephone on 09/30/21 at 3:00 PM   Please call the care guide team at [phone] if you need to cancel or reschedule your appointment.   If you are experiencing a Mental Health or [place] or need someone to talk to, please call the [facility]: [phone]   Following is a copy of your full plan of care:  Care Plan : [place]  Updates made by Katha Cabal, LCSW since 08/19/2021 12:00 AM     Problem: Emotional Distress      Goal: Emotional Health Supported. manage anxiety issues. manage stress issues. manage financial issues   Start Date: 07/14/2021  Expected End Date: 11/01/2021  This Visit's Progress: On track  Recent Progress: Not on track  Priority: Medium  Note:   Current Barriers:   Financial challenges Anxiety issues Stress issues Concern over job status (receives [employer], [employer]) Suicidal Ideation/Homicidal Ideation: No  Clinical Social Work Goal(s):  patient will work with SW monthly by telephone or in person to reduce or manage symptoms related to anxiety and depression issues Patient will communicate in next 30 days as needed with RNCM for nursing support Patient will attend scheduled medical appointments in next 30 days  Interventions: 1:1 collaboration with Renee Rival, FNP regarding development and update of comprehensive plan of care as evidenced by provider attestation and co-signature Discussed client needs with [name] Discussed sleeping challenges with [place].  Client said she has difficulty sleeping Discussed financial needs of client She receives Worker's Compensation benefit monthly Reviewed transport needs of client. She has  a car which she drives as needed. Reviewed mood of client. She is sad occasionally related to her financial needs and health needs. LCSW talked with client about counseling support with LCSW.  LCSW talked with client about stress management techniques. She likes playing games on her phone to help her relax. She likes spending time with her children and friends as ways to help her relax.  LCSW talked with client about patient strengths. She is now in a new rental residence. Her car is paid for which is a strength. She has good family support from her children. She will begin to receive Food Stamps benefit in a few weeks.  Discussed asthma challenges. She said she uses an inhaler as needed to help with breathing Discussed job situation.She receives [employer].  She has hand and wrist pain in right hand.  Encouraged Loyce to talk with RNCM as needed for nursing support Provided counseling support for client LCSW discussed with client the services of Care Management support Reviewed pain issues of client.   Patient Coping Strengths:  Family support Has no transport needs Attends medical appointments  Patient Self Care Deficits:  Anxiety issues Depression issues Financial issues  Patient Goals:  - spend time or talk with others at least 2 to 3 times per week - practice relaxation or meditation daily - keep a calendar with appointment dates  Follow Up Plan: LCSW to call client on 09/30/21 at 3:00 PM     Ms. Spader was given information about Care Management services by the embedded care coordination team including:  Care Management services include personalized support from designated clinical staff supervised  by her physician, including individualized plan of care and coordination with other care providers 24/7 contact phone numbers for assistance for urgent and routine care needs. The patient may stop CCM services at any time (effective at the end of the month) by phone call  to the office staff.  Patient agreed to services and verbal consent obtained.   Norva Riffle.Hurley Blevins MSW, [place] Holiday representative [facility] Care Management [phone]

## 2021-08-22 ENCOUNTER — Other Ambulatory Visit: Payer: Self-pay | Admitting: Nurse Practitioner

## 2021-08-22 DIAGNOSIS — E559 Vitamin D deficiency, unspecified: Secondary | ICD-10-CM

## 2021-09-30 ENCOUNTER — Ambulatory Visit: Payer: 59 | Admitting: Licensed Clinical Social Worker

## 2021-09-30 ENCOUNTER — Telehealth: Payer: Self-pay | Admitting: *Deleted

## 2021-09-30 DIAGNOSIS — F321 Major depressive disorder, single episode, moderate: Secondary | ICD-10-CM

## 2021-09-30 DIAGNOSIS — J4540 Moderate persistent asthma, uncomplicated: Secondary | ICD-10-CM

## 2021-09-30 DIAGNOSIS — E559 Vitamin D deficiency, unspecified: Secondary | ICD-10-CM

## 2021-09-30 DIAGNOSIS — G5603 Carpal tunnel syndrome, bilateral upper limbs: Secondary | ICD-10-CM

## 2021-09-30 NOTE — Patient Instructions (Signed)
Visit Information  Thank you for taking time to visit with me today. Please don't hesitate to contact me if I can be of assistance to you before our next scheduled telephone appointment.  Following are the goals we discussed today:   Follow up appointment via phone with LCSW on 10/28/21 at 10:30 AM  Please call the care guide team at [phone] if you need to cancel or reschedule your appointment.   If you are experiencing a Mental Health or [place] or need someone to talk to, please call the [facility]: [phone]   Following is a copy of your full plan of care:   Care Plan : [place]  Updates made by Katha Cabal, LCSW since 09/30/2021 12:00 AM     Problem: Emotional Distress      Goal: Emotional Health Supported. manage anxiety issues. manage stress issues. manage financial issues   Start Date: 07/14/2021  Expected End Date: 11/01/2021  This Visit's Progress: On track  Recent Progress: On track  Priority: Medium  Note:   Current Barriers:   Financial challenges Anxiety issues Stress issues Concern over job status (receives [employer]er, [employer]ery) Suicidal Ideation/Homicidal Ideation: No  Clinical Social Work Goal(s):  patient will work with SW monthly by telephone or in person to reduce or manage symptoms related to anxiety and depression issues Patient will communicate in next 30 days as needed with RNCM for nursing support Patient will attend scheduled medical appointments in next 30 days  Interventions: 1:1 collaboration with Renee Rival, FNP regarding development and update of comprehensive plan of care as evidenced by provider attestation and co-signature Discussed client needs with [name] Discussed sleeping challenges with [place].  Client said she has difficulty sleeping Discussed job situation.She receives [employer].  She has hand and wrist pain in right hand. She said she has been out  of work for about 2 years Reviewed Care Coordination program support. Rebecka Apley said she would like RN to call her to talk about her nursing needs Provided counseling support to client Reviewed family support for client  Patient Coping Strengths:  Family support Has no transport needs Attends medical appointments  Patient Self Care Deficits:  Anxiety issues Depression issues Financial issues  Patient Goals:  - spend time or talk with others at least 2 to 3 times per week - practice relaxation or meditation daily - keep a calendar with appointment dates  Follow Up Plan: LCSW to call client on 10/28/21 at 10:30 AM     Ms. Haigh was given information about Care Management services by the embedded care coordination team including:  Care Management services include personalized support from designated clinical staff supervised by her physician, including individualized plan of care and coordination with other care providers 24/7 contact phone numbers for assistance for urgent and routine care needs. The patient may stop CCM services at any time (effective at the end of the month) by phone call to the office staff.  Patient agreed to services and verbal consent obtained.   Norva Riffle.Jazzie Trampe MSW, [place] Holiday representative [facility] Care Management [phone]

## 2021-09-30 NOTE — Chronic Care Management (AMB) (Signed)
  Care Coordination   Note   09/30/2021 Name: Rimsha Trembley MRN: [id number] DOB: [birth year]  Amiyrah Lamere is a 56 y.o. year old female who sees Paseda, Dewaine Conger, FNP for primary care. I reached out to [name] by phone today to offer care coordination services.  Ms. Mcmanigal was given information about Care Coordination services today including:   The Care Coordination services include support from the care team which includes your Nurse Coordinator, Clinical Social Worker, or Pharmacist.  The Care Coordination team is here to help remove barriers to the health concerns and goals most important to you. Care Coordination services are voluntary, and the patient may decline or stop services at any time by request to their care team member.   Care Coordination Consent Status: Patient agreed to services and verbal consent obtained.   Follow up plan:  Telephone appointment with care coordination team member scheduled for:  RNCM on 10/06/21 and SW on 10/19/21  Encounter Outcome:  Pt. Scheduled  [place]  Direct Dial: [phone]

## 2021-09-30 NOTE — Chronic Care Management (AMB) (Signed)
Care Management Clinical Social Work Note  09/30/2021 Name: Cristina Harris MRN: [id number] DOB: Mar 12, 1965  Cristina Harris is a 56 y.o. year old female who is a primary care patient of Renee Rival, FNP.  The Care Management team was consulted for assistance with chronic disease management and coordination needs.  Engaged with patient by telephone for follow up visit in response to provider referral for social work chronic care management and care coordination services  Consent to Services:  Cristina Harris was given information about Care Management services today including:  Care Management services includes personalized support from designated clinical staff supervised by her physician, including individualized plan of care and coordination with other care providers 24/7 contact phone numbers for assistance for urgent and routine care needs. The patient may stop case management services at any time by phone call to the office staff.  Patient agreed to services and consent obtained.   Assessment: Review of patient past medical history, allergies, medications, and health status, including review of relevant consultants reports was performed today as part of a comprehensive evaluation and provision of chronic care management and care coordination services.  SDOH (Social Determinants of Health) assessments and interventions performed:  SDOH Interventions    Flowsheet Row Most Recent Value  SDOH Interventions   Stress Interventions Provide Counseling  [client has stress related to managing medical needs]  Depression Interventions/Treatment  Counseling        Advanced Directives Status: See Vynca application for related entries.  Care Plan  No Known Allergies  Outpatient Encounter Medications as of 09/30/2021  Medication Sig   acetaminophen (TYLENOL) 500 MG tablet Take by mouth.   albuterol (VENTOLIN HFA) 108 (90 Base) MCG/ACT inhaler SMARTSIG:2 Puff(s) By Mouth Twice Daily  PRN (Patient not taking: Reported on 08/02/2021)   budesonide-formoterol (SYMBICORT) 80-4.5 MCG/ACT inhaler Inhale 2 puffs into the lungs 2 (two) times daily.   Vitamin D, Ergocalciferol, (DRISDOL) 1.25 MG (50000 UNIT) CAPS capsule TAKE 1 CAPSULE (50,000 UNITS TOTAL) BY MOUTH EVERY 7 (SEVEN) DAYS   No facility-administered encounter medications on file as of 09/30/2021.    Patient Active Problem List   Diagnosis Date Noted   Bilateral carpal tunnel syndrome 08/02/2021   Vitamin D deficiency 08/02/2021   Annual physical exam 07/08/2021   Elevated BP without diagnosis of hypertension 07/08/2021   Obesity (BMI 30-39.9) 07/08/2021   Asthma 07/08/2021   Depression, major, single episode, moderate ([place]) 07/08/2021    Conditions to be addressed/monitored: monitor client management of medical needs  Care Plan : Cristina Harris  Updates made by Katha Cabal, LCSW since 09/30/2021 12:00 AM     Problem: Emotional Distress      Goal: Emotional Health Supported. manage anxiety issues. manage stress issues. manage financial issues   Start Date: 07/14/2021  Expected End Date: 11/01/2021  This Visit's Progress: On track  Recent Progress: On track  Priority: Medium  Note:   Current Barriers:   Financial challenges Anxiety issues Stress issues Concern over job status (receives [employer]er, [employer]ellery) Suicidal Ideation/Homicidal Ideation: No  Clinical Social Work Goal(s):  patient will work with SW monthly by telephone or in person to reduce or manage symptoms related to anxiety and depression issues Patient will communicate in next 30 days as needed with RNCM for nursing support Patient will attend scheduled medical appointments in next 30 days  Interventions: 1:1 collaboration with Renee Rival, FNP regarding development and update of comprehensive plan of care as evidenced by provider attestation and co-signature  Discussed client needs with Cristina Harris Discussed sleeping  challenges with Cristina Harris.  Client said she has difficulty sleeping Discussed job situation.She receives [employer].  She has hand and wrist pain in right hand. She said she has been out of work for about 2 years Reviewed Care Coordination program support. Cristina Harris said she would like RN to call her to talk about her nursing needs Provided counseling support to client Reviewed family support for client  Patient Coping Strengths:  Family support Has no transport needs Attends medical appointments  Patient Self Care Deficits:  Anxiety issues Depression issues Financial issues  Patient Goals:  - spend time or talk with others at least 2 to 3 times per week - practice relaxation or meditation daily - keep a calendar with appointment dates  Follow Up Plan: LCSW to call client on 10/28/21 at 10:30 AM     Norva Riffle.Cristina Harris MSW, [place] Holiday representative [facility] Care Management [phone]

## 2021-10-06 ENCOUNTER — Encounter: Payer: Self-pay | Admitting: *Deleted

## 2021-10-06 ENCOUNTER — Ambulatory Visit: Payer: Self-pay | Admitting: *Deleted

## 2021-10-06 DIAGNOSIS — J209 Acute bronchitis, unspecified: Secondary | ICD-10-CM | POA: Insufficient documentation

## 2021-10-06 DIAGNOSIS — N912 Amenorrhea, unspecified: Secondary | ICD-10-CM | POA: Insufficient documentation

## 2021-10-06 DIAGNOSIS — J0140 Acute pansinusitis, unspecified: Secondary | ICD-10-CM | POA: Insufficient documentation

## 2021-10-06 NOTE — Patient Outreach (Signed)
  Care Coordination   Initial Visit Note   10/06/2021 Name: Cristina Harris MRN: [id number] DOB: [birth year]  Cristina Harris is a 56 y.o. year old female who sees Paseda, Dewaine Conger, FNP for primary care. I spoke with  Cristina Harris by phone today.  What matters to the patients health and wellness today? New insurance coverage secures as [employer] silver [employer] Scan card at [place] out of work 2 yrs work comp  hands swell numbness stress depressed sleep  Pain bilateral hands not include medicines pain level 7 0-1 ach nagging constant has braces sleep on hand in pitch of wrist  Asthma    Goals Addressed               This Visit's Progress     Patient Stated     Research officer, trade union for counseling [facility]) (pt-stated)   Not on track     Care Coordination Interventions: Evaluation of current treatment plan related to management of depression related to her health and decrease in income/disability and patient's adherence to plan as established by provider Discussed plans with patient for ongoing care management follow up and provided patient with direct contact information for care management team Screening for signs and symptoms of depression related to chronic disease state  Assessed social determinant of health barriers Active listening, support provided, Assessed readiness for local counseling, discussed referral for counselor  Collaboration with [place] worker (SW) as needed Encouraged her to have any [place] office to scan in her new [employer] Silver [facility] health card during her next appointment.  Confirmed with her that she is still eligible for [facility] services  Discuss pending change of [facility] SW. Discussed outreach from [place] [facility] Social worker) education via my chart sent on managing chronic pain, depression & sleep      manage pain of both hands ([facility]) (pt-stated)   Not on track     Care Coordination Interventions: Screening for signs and symptoms of depression related to chronic  disease state  Assessed social determinant of health barriers Assessed pain, assessed for any pain management provider, discussed pain management services. Acknowledge patient preference not to use lots of medicine for pain, has hand braces & uses Tylenol for pain  education via my chart sent on managing chronic pain, depression & sleep        SDOH assessments and interventions completed:  Yes  SDOH Interventions Today    Flowsheet Row Most Recent Value  SDOH Interventions   Food Insecurity Interventions Intervention Not Indicated  Stress Interventions Provide Counseling, Offered [employer]  [facility] SW providing counseling Offer to assist with finding a local counselor]  Transportation Interventions Intervention Not Indicated  Depression Interventions/Treatment  Counseling        Care Coordination Interventions Activated:  Yes  Care Coordination Interventions:  Yes, provided   Follow up plan: Follow up call scheduled for 10/20/21    Encounter Outcome:  Pt. Visit Completed

## 2021-10-06 NOTE — Patient Instructions (Addendum)
Visit Information  Thank you for taking time to visit with me today. Please don't hesitate to contact me if I can be of assistance to you.   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-stated)   Not on track     Care Coordination Interventions: Evaluation of current treatment plan related to management of depression related to her health and decrease in income/disability and patient's adherence to plan as established by provider Discussed plans with patient for ongoing care management follow up and provided patient with direct contact information for care management team Screening for signs and symptoms of depression related to chronic disease state  Assessed social determinant of health barriers Active listening, support provided, Assessed readiness for local counseling, discussed referral for counselor  Collaboration with [place] worker (SW) as needed Encouraged her to have any MD office to scan in her new [employer] [facility] health card during her next appointment.  Confirmed with her that she is still eligible for [facility] services  Discuss pending change of [facility] SW. Discussed outreach from [place] [facility] Social worker) education via my chart sent on managing chronic pain, depression & sleep      manage pain of both hands ([facility]) (pt-stated)   Not on track     Care Coordination Interventions: Screening for signs and symptoms of depression related to chronic disease state  Assessed social determinant of health barriers Assessed pain, assessed for any pain management provider, discussed pain management services. Acknowledge patient preference not to use lots of medicine for pain, has hand braces & uses Tylenol for pain  education via my chart sent on managing chronic pain, depression & sleep        Our next appointment is by telephone on 10/20/21 at 11  am  Please call the care guide team at [phone] if you need to cancel or reschedule your appointment.   If you are experiencing a Mental Health or [place] or need someone to talk to, please call the Suicide and Crisis Lifeline: 988 call the [place] National Suicide Prevention Lifeline: 412-855-4348 or TTY: 702 546 2297 TTY 3434511303) to talk to a trained counselor call 1-800-273-TALK (toll free, 24 hour hotline) call the [facility]: 920-627-9979 call 911   Patient verbalizes understanding of instructions and care plan provided today and agrees to view in [place]. Active MyChart status and patient understanding of how to access instructions and care plan via MyChart confirmed with patient.     The patient has been provided with contact information for the care management team and has been advised to call with any health related questions or concerns.   Miles Lavina Hamman, RN, BSN, [place] Coordinator Office number [phone]

## 2021-10-13 ENCOUNTER — Ambulatory Visit: Payer: 59 | Admitting: Nurse Practitioner

## 2021-10-17 ENCOUNTER — Ambulatory Visit (INDEPENDENT_AMBULATORY_CARE_PROVIDER_SITE_OTHER): Payer: 59 | Admitting: Internal Medicine

## 2021-10-17 ENCOUNTER — Encounter: Payer: Self-pay | Admitting: Internal Medicine

## 2021-10-17 VITALS — BP 134/82 | HR 87 | Ht 64.0 in | Wt 222.8 lb

## 2021-10-17 DIAGNOSIS — J45909 Unspecified asthma, uncomplicated: Secondary | ICD-10-CM | POA: Diagnosis not present

## 2021-10-17 DIAGNOSIS — E669 Obesity, unspecified: Secondary | ICD-10-CM

## 2021-10-17 DIAGNOSIS — Z803 Family history of malignant neoplasm of breast: Secondary | ICD-10-CM

## 2021-10-17 DIAGNOSIS — Z2821 Immunization not carried out because of patient refusal: Secondary | ICD-10-CM | POA: Diagnosis not present

## 2021-10-17 DIAGNOSIS — E559 Vitamin D deficiency, unspecified: Secondary | ICD-10-CM

## 2021-10-17 DIAGNOSIS — Z720 Tobacco use: Secondary | ICD-10-CM | POA: Diagnosis not present

## 2021-10-17 DIAGNOSIS — R030 Elevated blood-pressure reading, without diagnosis of hypertension: Secondary | ICD-10-CM

## 2021-10-17 DIAGNOSIS — Z0000 Encounter for general adult medical examination without abnormal findings: Secondary | ICD-10-CM | POA: Diagnosis not present

## 2021-10-17 MED ORDER — ALBUTEROL SULFATE HFA 108 (90 BASE) MCG/ACT IN AERS: 1.0000 | INHALATION_SPRAY | RESPIRATORY_TRACT | 0 refills | Status: DC | PRN

## 2021-10-17 NOTE — Assessment & Plan Note (Signed)
Current everyday smoker.  Previously quit earlier this year, but resumed smoking due to stress related to being unable to work.  She is aware that she needs to quit, but remains precontemplative for now with regards to cessation. -The patient was counseled on the dangers of tobacco use, and was advised to quit.  Reviewed strategies to maximize success, including removing cigarettes and smoking materials from environment, stress management, substitution of other forms of reinforcement, support of family/friends and written materials.

## 2021-10-17 NOTE — Assessment & Plan Note (Signed)
-  States that she had a Pap smear performed in [facility].  She will bring a copy of her records to her next appointment. -Screening mammogram ordered today.  Last mammogram > 2 years ago and her family history is significant for breast cancer -Cologuard ordered for colorectal cancer screening.  She has not previously undergone colorectal cancer screening. -She would like to defer Shingrix for now

## 2021-10-17 NOTE — Assessment & Plan Note (Signed)
Total vitamin D level 14 in June.  Treated with vitamin D 50,000 units weekly supplementation x8 weeks. -Repeat vitamin D level today.  If still deficient, plan to treat again with 50,000 units x 12 weeks, otherwise start daily supplementation with 2000 units.

## 2021-10-17 NOTE — Assessment & Plan Note (Signed)
She has recently made significant lifestyle changes aimed at losing weight.  She is now going to the [facility] regularly and is working to improve her diet.  She would like to start a "diet pill". -I congratulated her on taking these initial steps towards lowering her weight.  I have also provided her with information regarding the Mediterranean diet and encouraged her to incorporate elements of the diet into her own. -We will follow-up in 6 weeks and can discuss starting medication for obesity, such as GLP-1 therapy.

## 2021-10-17 NOTE — Progress Notes (Signed)
Established Patient Office Visit  Subjective   Patient ID: Cristina Harris, female    DOB: 09/14/1965  Age: 56 y.o. MRN: [id number]  Chief Complaint  Patient presents with   Follow-up   Cristina Harris is a 56 year old woman returning for follow-up today.  She has a past medical history significant for asthma, carpal tunnel syndrome, obesity, and elevated BP.  Last seen at [facility] by Cristina Rua, NP at which time her blood pressure was noted to be elevated and she was prescribed weekly vitamin D 50,000 units supplementation her vitamin D deficiency.  Today Cristina Harris states that she is feeling well.  She is asymptomatic and has no acute concerns.  Cristina Harris is not taking Symbicort that she was recently prescribed for asthma.  She states that she tried using the inhaler twice, but it made her feel as though she was suffocating.  She is also out of her albuterol inhaler and requests a refill.  Cristina Harris has not had her asthma inhaler in several weeks and has not felt the need to use it recently.  She has also resumed smoking after quitting earlier this year.  She attributes this to stress related to an being unable to work due to a wrist injury.  Acute concern and chronic medical issues discussed today are individually addressed in A/P below.  Past Medical History:  Diagnosis Date   Carpal tunnel syndrome on both sides    Past Surgical History:  Procedure Laterality Date   CARPAL TUNNEL RELEASE Right 2022   NASAL FRACTURE SURGERY     TUBAL LIGATION     WRIST ARTHROSCOPY Right 2022   Social History   Tobacco Use   Smoking status: Former    Types: Cigarettes    Quit date: 04/2021    Years since quitting: 0.5   Smokeless tobacco: Never   Tobacco comments:    Started smoking at age 56, quit smoking 2023.   Vaping Use   Vaping Use: Former  Substance Use Topics   Alcohol use: Yes    Comment: occ   Drug use: No   Family History  Problem Relation Age of Onset   Breast cancer  Mother    Heart failure Father    Diabetes type II Father    Heart failure Brother    Lupus Daughter    Diabetes type II Maternal Grandmother    Colon cancer Neg Hx    Cervical cancer Neg Hx    No Known Allergies    Elevated BP  Obesity  Asthma  Review of Systems  Musculoskeletal:        Chronic bilateral wrist pain  All other systems reviewed and are negative.     Objective:     BP 134/82   Pulse 87   Ht $R'5\' 4"'uc$  (1.626 m)   Wt 222 lb 12.8 oz (101.1 kg)   LMP 03/06/2013   SpO2 95%   BMI 38.24 kg/m  BP Readings from Last 3 Encounters:  10/17/21 134/82  08/02/21 128/84  07/08/21 134/89      Physical Exam Vitals reviewed.  Constitutional:      General: She is not in acute distress.    Appearance: Normal appearance. She is obese. She is not toxic-appearing.  HENT:     Head: Normocephalic and atraumatic.     Nose: Nose normal. No congestion or rhinorrhea.     Mouth/Throat:     Mouth: Mucous membranes are moist.     Pharynx: Oropharynx is  clear. No oropharyngeal exudate or posterior oropharyngeal erythema.  Eyes:     General: No scleral icterus.    Extraocular Movements: Extraocular movements intact.     Conjunctiva/sclera: Conjunctivae normal.     Pupils: Pupils are equal, round, and reactive to light.  Cardiovascular:     Rate and Rhythm: Normal rate and regular rhythm.     Pulses: Normal pulses.     Heart sounds: Normal heart sounds. No murmur heard.    No friction rub. No gallop.  Pulmonary:     Effort: Pulmonary effort is normal. No respiratory distress.     Breath sounds: Normal breath sounds. No wheezing, rhonchi or rales.  Abdominal:     General: Abdomen is flat. Bowel sounds are normal. There is no distension.     Palpations: Abdomen is soft.     Tenderness: There is no abdominal tenderness.  Musculoskeletal:        General: No swelling or deformity.     Cervical back: Normal range of motion.     Right lower leg: No edema.     Left lower  leg: No edema.  Skin:    General: Skin is warm and dry.     Capillary Refill: Capillary refill takes less than 2 seconds.     Coloration: Skin is not jaundiced.  Neurological:     General: No focal deficit present.     Mental Status: She is alert and oriented to person, place, and time.     Motor: No weakness.     Gait: Gait normal.  Psychiatric:        Mood and Affect: Mood normal.        Behavior: Behavior normal.        Thought Content: Thought content normal.        Judgment: Judgment normal.    No results found for any visits on 10/17/21.  Last CBC Lab Results  Component Value Date   WBC 5.9 07/11/2021   HGB 12.8 07/11/2021   HCT 37.6 07/11/2021   MCV 90 07/11/2021   MCH 30.7 07/11/2021   RDW 13.0 07/11/2021   PLT 374 70/96/2836   Last metabolic panel Lab Results  Component Value Date   GLUCOSE 86 07/11/2021   NA 145 (H) 07/11/2021   K 4.3 07/11/2021   CL 107 (H) 07/11/2021   CO2 24 07/11/2021   BUN 13 07/11/2021   CREATININE 0.89 07/11/2021   EGFR 76 07/11/2021   CALCIUM 9.3 07/11/2021   PROT 7.6 07/11/2021   ALBUMIN 4.1 07/11/2021   LABGLOB 3.5 07/11/2021   AGRATIO 1.2 07/11/2021   BILITOT 0.4 07/11/2021   ALKPHOS 72 07/11/2021   AST 13 07/11/2021   ALT 21 07/11/2021   ANIONGAP 4 (L) 07/06/2016   Last lipids Lab Results  Component Value Date   CHOL 220 (H) 07/11/2021   HDL 60 07/11/2021   LDLCALC 148 (H) 07/11/2021   TRIG 71 07/11/2021   CHOLHDL 3.7 07/11/2021   Last hemoglobin A1c Lab Results  Component Value Date   HGBA1C 5.5 07/11/2021   Last thyroid functions Lab Results  Component Value Date   TSH 1.000 07/11/2021   Last vitamin D Lab Results  Component Value Date   VD25OH 14.0 (L) 07/11/2021   The 10-year ASCVD risk score (Arnett DK, et al., 2019) is: 4.6%    Assessment & Plan:   Problem List Items Addressed This Visit       Respiratory   Asthma  Chronic condition.  Recently prescribed Symbicort but describes an  adverse response.  She is not interested in a daily medication.  She has been out of her albuterol inhaler several weeks and has not felt the need to use it. -I have refilled albuterol for as needed use today -We discussed that if she is frequently using her albuterol inhaler that a daily, long-acting medication is indicated.  We will review the frequency of her albuterol use at follow-up and 6 weeks. -I encouraged her to stop smoking      Relevant Medications   albuterol (VENTOLIN HFA) 108 (90 Base) MCG/ACT inhaler     Other   Preventative health care    -States that she had a Pap smear performed in February of last [facility].  She will bring a copy of her records to her next appointment. -Screening mammogram ordered today.  Last mammogram > 2 years ago and her family history is significant for breast cancer -Cologuard ordered for colorectal cancer screening.  She has not previously undergone colorectal cancer screening. -She would like to defer Shingrix for now      Relevant Orders   MM Digital Diagnostic Bilat   Cologuard   Elevated BP without diagnosis of hypertension    BP 134/82 today.  Elevated BP noted at last appointment as well. -We discussed that if her blood pressure remains elevated at her next appointment we will need to start treatment.  She expressed understanding.      Obesity (BMI 30-39.9)    She has recently made significant lifestyle changes aimed at losing weight.  She is now going to the [facility] regularly and is working to improve her diet.  She would like to start a "diet pill". -I congratulated her on taking these initial steps towards lowering her weight.  I have also provided her with information regarding the Mediterranean diet and encouraged her to incorporate elements of the diet into her own. -We will follow-up in 6 weeks and can discuss starting medication for obesity, such as GLP-1 therapy.      Vitamin D deficiency    Total vitamin D  level 14 in June.  Treated with vitamin D 50,000 units weekly supplementation x8 weeks. -Repeat vitamin D level today.  If still deficient, plan to treat again with 50,000 units x 12 weeks, otherwise start daily supplementation with 2000 units.      Relevant Orders   Vitamin D (25 hydroxy)   Tobacco use    Current everyday smoker.  Previously quit earlier this year, but resumed smoking due to stress related to being unable to work.  She is aware that she needs to quit, but remains precontemplative for now with regards to cessation. -The patient was counseled on the dangers of tobacco use, and was advised to quit.  Reviewed strategies to maximize success, including removing cigarettes and smoking materials from environment, stress management, substitution of other forms of reinforcement, support of family/friends and written materials.      Return in about 6 weeks (around 11/28/2021).    Johnette Abraham, MD

## 2021-10-17 NOTE — Assessment & Plan Note (Signed)
Chronic condition.  Recently prescribed Symbicort but describes an adverse response.  She is not interested in a daily medication.  She has been out of her albuterol inhaler several weeks and has not felt the need to use it. -I have refilled albuterol for as needed use today -We discussed that if she is frequently using her albuterol inhaler that a daily, long-acting medication is indicated.  We will review the frequency of her albuterol use at follow-up and 6 weeks. -I encouraged her to stop smoking

## 2021-10-17 NOTE — Patient Instructions (Signed)
It was a pleasure to see you today.  Thank you for giving [place] the opportunity to be involved in your care.  Below is a brief recap of your visit and next steps.  We will plan to see you again in 6 weeks  Summary I have refilled albuterol today I have also ordered a mammogram and cologuard for cancer screenings  Next steps Follow up in 6 weeks to discuss weight loss, blood pressure, and asthma

## 2021-10-17 NOTE — Assessment & Plan Note (Signed)
BP 134/82 today.  Elevated BP noted at last appointment as well. -We discussed that if her blood pressure remains elevated at her next appointment we will need to start treatment.  She expressed understanding.

## 2021-10-18 ENCOUNTER — Other Ambulatory Visit: Payer: Self-pay

## 2021-10-18 ENCOUNTER — Telehealth: Payer: Self-pay | Admitting: Internal Medicine

## 2021-10-18 DIAGNOSIS — Z1231 Encounter for screening mammogram for malignant neoplasm of breast: Secondary | ICD-10-CM

## 2021-10-18 LAB — VITAMIN D 25 HYDROXY (VIT D DEFICIENCY, FRACTURES): Vit D, 25-Hydroxy: 20.9 ng/mL — ABNORMAL LOW (ref 30.0–100.0)

## 2021-10-18 NOTE — Telephone Encounter (Signed)
Per radiology mammo order is in as a diagnostic with dx for preventative health. States they cant use this dx code with dx mammo. If pt is not having issues can you please change to screening?

## 2021-10-19 ENCOUNTER — Encounter: Payer: Self-pay | Admitting: *Deleted

## 2021-10-19 ENCOUNTER — Ambulatory Visit: Payer: Self-pay | Admitting: *Deleted

## 2021-10-19 NOTE — Patient Outreach (Signed)
Care Coordination   Initial Visit Note   10/19/2021  Name: Cristina Harris MRN: [id number] DOB: 22-Aug-1965  Cristina Harris is a 56 y.o. year old female who sees Doren Custard, Hazle Nordmann, MD for primary care. I spoke with Cristina Harris by phone today.  What matters to the patients health and wellness today?  Find Help in My Community.  Reduce and Manage Symptoms of Depression.    Goals Addressed               This Visit's Progress     COMPLETED: Anxiety Symptoms Monitored and Managed. Manage anxiety. Manage depression issues (pt-stated)        Time Frame:  Short Term Goal Priority:  High Progress:  On Track  Start Date:  07/14/21 End Date:  11/01/21   Follow Up Date:  10/28/21 at 10:30 AM    Anxiety symptoms monitored and managed. Manage anxiety. Manage depression issues  Patient Coping Strengths:  Family support Has no transport needs Attends medical appointments  Patient Self Care Deficits:  Anxiety issues Depression issues Financial issues  Patient Goals:  - spend time or talk with others at least 2 to 3 times per week - practice relaxation or meditation daily - keep a calendar with appointment dates  Follow Up Plan: LCSW to call client on 10/28/21 at 10:30 PM       Find Help in My Community. (pt-stated)   On track     Care Coordination Interventions:   Reviewed various financial resources, discussed options, and provided information on Emergency Assistance Programs in [place].   Review information on How to Apply for Adult Medicaid, through the [place]. Review 2023 Medicaid Tips. Complete Medicaid Application and Submit to the [place] for processing.         Reduce and Manage Symptoms of Depression. (pt-stated)   On track     Care Coordination Interventions:  PHQ2/PHQ9 Depression Screen Completed & Results Reviewed.   Suicidal Ideation/Homicidal Ideation Assessed  - None Present. Solution-Focused Strategies Employed. Deep Breathing Exercises, Relaxation Techniques & Mindfulness Meditation Strategies Taught & Encouraged Daily.   Active Listening/Reflection Utilized.  Emotional Support Provided. Verbalization of Feelings Encouraged.  Problem Solving/Task Centered Solutions Developed.   Provided Psychoeducation for Mental Health Concerns. Reviewed Mental Health Medications & Discussed Importance of Compliance. Quality of Sleep Assessed & Sleep Hygiene Techniques Promoted. Participation in Counseling Encouraged. Discussed Referral to Psychiatrist for Psychotropic Medication Management. Discussed Referral to Therapist for Psychotherapeutic Counseling Services.         SDOH assessments and interventions completed:  Yes.    SDOH Interventions Today    Flowsheet Row Most Recent Value  SDOH Interventions   Food Insecurity Interventions Intervention Not Indicated  Housing Interventions Intervention Not Indicated  Transportation Interventions Intervention Not Indicated  Utilities Interventions Intervention Not Indicated  Alcohol Usage Interventions Intervention Not Indicated (Score <7)  Depression Interventions/Treatment  Referral to Psychiatry, Medication, Counseling  Financial Strain Interventions Other (Comment)  [Provided Financial Resources]  Physical Activity Interventions Patient Refused  Stress Interventions Offered [employer], Provide Counseling, Other (Comment)  [Provided Counseling Resources]  Social Connections Interventions Intervention Not Indicated        Care Coordination Interventions Activated:  Yes.   Care Coordination Interventions:  Yes, provided.   Follow up plan: Follow up call scheduled for 11/03/2021 at 10:00 am.  Encounter Outcome:  Pt. Visit Completed.   Nat Christen, BSW, MSW, [employer]  Licensed Clinical Social  Worker  [place]  Mailing  Address-[address], [place], [place] [address] Physical Address-[address], [place], [place] [address] Toll Free Main # [phone] Fax # [phone] Cell # [phone] [email or web address]

## 2021-10-19 NOTE — Patient Instructions (Signed)
Visit Information  Thank you for taking time to visit with me today. Please don't hesitate to contact me if I can be of assistance to you.   Following are the goals we discussed today:   Goals Addressed               This Visit's Progress     COMPLETED: Anxiety Symptoms Monitored and Managed. Manage anxiety. Manage depression issues (pt-stated)        Time Frame:  Short Term Goal Priority:  High Progress:  On Track  Start Date:  07/14/21 End Date:  11/01/21   Follow Up Date:  10/28/21 at 10:30 AM    Anxiety symptoms monitored and managed. Manage anxiety. Manage depression issues  Patient Coping Strengths:  Family support Has no transport needs Attends medical appointments  Patient Self Care Deficits:  Anxiety issues Depression issues Financial issues  Patient Goals:  - spend time or talk with others at least 2 to 3 times per week - practice relaxation or meditation daily - keep a calendar with appointment dates  Follow Up Plan: LCSW to call client on 10/28/21 at 10:30 PM       Find Help in My Community. (pt-stated)   On track     Care Coordination Interventions:   Reviewed various financial resources, discussed options, and provided information on Emergency Assistance Programs in [place].   Review information on How to Apply for Adult Medicaid, through the [place]. Review 2023 Medicaid Tips. Complete Medicaid Application and Submit to the [place] for processing.         Reduce and Manage Symptoms of Depression. (pt-stated)   On track     Care Coordination Interventions:  PHQ2/PHQ9 Depression Screen Completed & Results Reviewed.   Suicidal Ideation/Homicidal Ideation Assessed - None Present. Solution-Focused Strategies Employed. Deep Breathing Exercises, Relaxation Techniques & Mindfulness Meditation Strategies Taught & Encouraged Daily.   Active Listening/Reflection  Utilized.  Emotional Support Provided. Verbalization of Feelings Encouraged.  Problem Solving/Task Centered Solutions Developed.   Provided Psychoeducation for Mental Health Concerns. Reviewed Mental Health Medications & Discussed Importance of Compliance. Quality of Sleep Assessed & Sleep Hygiene Techniques Promoted. Participation in Counseling Encouraged. Discussed Referral to Psychiatrist for Psychotropic Medication Management. Discussed Referral to Therapist for Psychotherapeutic Counseling Services.         Our next appointment is by telephone on 11/03/2021 at 10:00 am.  Please call the care guide team at [phone] if you need to cancel or reschedule your appointment.   If you are experiencing a Mental Health or [place] or need someone to talk to, please call the Suicide and Crisis Lifeline: 988 call the [place] National Suicide Prevention Lifeline: 640-175-5884 or TTY: 8288286575 TTY (463) 163-6318) to talk to a trained counselor call 1-800-273-TALK (toll free, 24 hour hotline) go to [facility] [address], [place] [phone]) call the [place]: [phone] call 911  Patient verbalizes understanding of instructions and care plan provided today and agrees to view in [place]. Active MyChart status and patient understanding of how to access instructions and care plan via MyChart confirmed with patient.     Telephone follow up appointment with care management team member scheduled for:  11/03/2021 at 10:00 am.  Nat Christen, BSW, MSW, [place]  Licensed Clinical Social Worker  [place]  Mailing [place]. [address], [place], [place] [address] Physical Address[address] [place], [place] [address] Toll Free Main # [phone] Fax # [phone] Cell # [phone] [email or web address]

## 2021-10-20 ENCOUNTER — Ambulatory Visit: Payer: Self-pay | Admitting: *Deleted

## 2021-10-20 NOTE — Patient Instructions (Signed)
Visit Information  Thank you for taking time to visit with me today. Please don't hesitate to contact me if I can be of assistance to you.   Following are the goals we discussed today:   Goals Addressed               This Visit's Progress     Patient Stated     Research officer, trade union for counseling [facility]) (pt-stated)        Care Coordination Interventions: Discussed plans with patient for ongoing care management follow up and provided patient with direct contact information for care management team Assessed social determinant of health barriers Active listening, support provided, Assessed readiness for local counseling, discussed referral for counselor  Collaboration with [place] worker (SW) as needed Confirmed her pcp office scanned in her new [place] [facility] health card during her 10/17/21 appointment.  Confirmed her [place] outreach from [facility] Social worker) education via my chart sent on managing chronic pain, depression & sleep Discussed vocational rehab She wants her company & case worker to initiate this service         Our next appointment is by telephone on 11/07/21 at 1130  Please call the care guide team at [phone] if you need to cancel or reschedule your appointment.   If you are experiencing a Mental Health or [place] or need someone to talk to, please call the Suicide and Crisis Lifeline: 988 call the [place] National Suicide Prevention Lifeline: 470-221-6062 or TTY: (586)641-2050 TTY 323-060-8988) to talk to a trained counselor call 1-800-273-TALK (toll free, 24 hour hotline) call the [facility]: 212-758-8872 call 911   Patient verbalizes understanding of instructions and care plan provided today and agrees to view in [place]. Active MyChart status and patient understanding of how to access instructions and care plan via MyChart confirmed with patient.     The patient has been provided with contact information  for the care management team and has been advised to call with any health related questions or concerns.   [place] Lavina Hamman, RN, BSN, [place] Coordinator Office number [phone]

## 2021-10-20 NOTE — Patient Outreach (Signed)
  Care Coordination   Follow Up Visit Note   10/20/2021 Name: Menaal Russum MRN: [id number] DOB: 02/12/1965  Axelle Szwed is a 56 y.o. year old female who sees Doren Custard, Hazle Nordmann, MD for primary care. I spoke with  [name] by phone today.  What matters to the patients health and wellness today?  Follow up outreach from [place] referral and recent pcp office visit Voiced her appreciation of [facility] SW outreach & resources Appreciative of information on vocational rehab Shanon Brow cook to be seen in person as her case worker for disability  Will be interested in vocational rehabilitation   From recent pcp visit she is able to state that her BP needs below 130/80  She is aware that her BP is elevated related to her increases pain of her hands/joints  Confirms she has a BP cuff at home but had BP checks at various MDs lately   Discussed risks of stroke with continued elevated BP She voiced understanding and her value to be around to enjoy her grandchildren This is her motivation to improve her BP      Goals Addressed               This Visit's Progress     Patient Stated     Find community resource for counseling [facility]) (pt-stated)        Care Coordination Interventions: Discussed plans with patient for ongoing care management follow up and provided patient with direct contact information for care management team Assessed social determinant of health barriers Active listening, support provided, Assessed readiness for local counseling, discussed referral for counselor  Collaboration with [place] worker (SW) as needed Confirmed her pcp office scanned in her new [place] [facility] health card during her 10/17/21 appointment.  Confirmed her [place] outreach from [place] [facility] Social worker) education via my chart sent on managing chronic pain, depression & sleep Discussed vocational rehab She wants her company & case worker to initiate this service         SDOH assessments and  interventions completed:  Yes  SDOH Interventions Today    Flowsheet Row Most Recent Value  SDOH Interventions   Social Connections Interventions Intervention Not Indicated        Care Coordination Interventions Activated:  No  Care Coordination Interventions:  No, not indicated   Follow up plan: Follow up call scheduled for 11/07/21 1130    Encounter Outcome:  Pt. Visit Completed   Niana Martorana L. Lavina Hamman, RN, BSN, [place] Coordinator Office number [phone]

## 2021-10-21 ENCOUNTER — Other Ambulatory Visit: Payer: Self-pay | Admitting: Internal Medicine

## 2021-10-21 DIAGNOSIS — E559 Vitamin D deficiency, unspecified: Secondary | ICD-10-CM

## 2021-10-21 MED ORDER — VITAMIN D (ERGOCALCIFEROL) 1.25 MG (50000 UNIT) PO CAPS: 50000.0000 [IU] | ORAL_CAPSULE | ORAL | 0 refills | Status: DC

## 2021-10-25 ENCOUNTER — Emergency Department (HOSPITAL_COMMUNITY): Admission: EM | Admit: 2021-10-25 | Discharge: 2021-10-25 | Discharge status: Against Medical Advice | Payer: 59

## 2021-10-25 DIAGNOSIS — Z0000 Encounter for general adult medical examination without abnormal findings: Secondary | ICD-10-CM | POA: Diagnosis not present

## 2021-10-28 ENCOUNTER — Telehealth: Payer: 59

## 2021-10-29 LAB — COLOGUARD: COLOGUARD: POSITIVE — AB

## 2021-10-31 ENCOUNTER — Encounter (INDEPENDENT_AMBULATORY_CARE_PROVIDER_SITE_OTHER): Payer: Self-pay | Admitting: *Deleted

## 2021-10-31 ENCOUNTER — Other Ambulatory Visit: Payer: Self-pay | Admitting: Internal Medicine

## 2021-10-31 DIAGNOSIS — R195 Other fecal abnormalities: Secondary | ICD-10-CM

## 2021-11-02 ENCOUNTER — Telehealth: Payer: Self-pay | Admitting: Internal Medicine

## 2021-11-02 ENCOUNTER — Other Ambulatory Visit: Payer: Self-pay

## 2021-11-02 DIAGNOSIS — E559 Vitamin D deficiency, unspecified: Secondary | ICD-10-CM

## 2021-11-02 DIAGNOSIS — J45909 Unspecified asthma, uncomplicated: Secondary | ICD-10-CM

## 2021-11-02 MED ORDER — ALBUTEROL SULFATE HFA 108 (90 BASE) MCG/ACT IN AERS: 1.0000 | INHALATION_SPRAY | RESPIRATORY_TRACT | 0 refills | Status: DC | PRN

## 2021-11-02 MED ORDER — VITAMIN D (ERGOCALCIFEROL) 1.25 MG (50000 UNIT) PO CAPS: 50000.0000 [IU] | ORAL_CAPSULE | ORAL | 0 refills | Status: AC

## 2021-11-02 NOTE — Telephone Encounter (Signed)
Sent!

## 2021-11-02 NOTE — Telephone Encounter (Signed)
Patient called in needs  [facility] .   [facility] all [facility] [address]

## 2021-11-03 ENCOUNTER — Encounter: Payer: Self-pay | Admitting: *Deleted

## 2021-11-03 ENCOUNTER — Ambulatory Visit: Payer: Self-pay | Admitting: *Deleted

## 2021-11-03 NOTE — Patient Outreach (Signed)
  Care Coordination   Follow Up Visit Note   11/03/2021  Name: Cristina Harris MRN: [id number] DOB: [birth year]  Cristina Harris is a 56 y.o. year old female who sees Doren Custard, Hazle Nordmann, MD for primary care. I spoke with Cristina Harris by phone today.  What matters to the patients health and wellness today?  Find Help in My Community and Reduce and Manage Symptoms of Depression.   Goals Addressed               This Visit's Progress     Find Help in My Community. (pt-stated)   On track     Care Coordination Interventions:   Reviewed Information on How to Apply for Adult Medicaid, Through the [place]. Reviewed 2023 Medicaid Tips to Ensure Eligibility. Submit Completed and Signed Medicaid Application to the [place] for Processing.   Review List of Emergency Assistance Programs, Mailed on 11/03/2021. Complete [place] Financial Assistance Application and Submit to [facility] Department for Processing.       Reduce and Manage Symptoms of Depression. (pt-stated)   On track     Care Coordination Interventions:  Deep Breathing Exercises, Relaxation Techniques & Mindfulness Meditation Strategies Reviewed & Encouraged Daily.   Active Listening/Reflection Utilized.  Emotional Support Provided. Verbalization of Feelings Encouraged.  Review the Following List of Counseling Agencies and Resources, Emailed on 10/19/2021 and Mailed on 11/03/2021: ~ [place] ~ [place] ~ Marriage, Relationship, Individual and Family Counseling Services ~ Counseling Agencies in [place] to Discuss the List of [employer] and Resources Provided and Decide on An Agency of Interest.            SDOH assessments and interventions completed:  Yes.  Care Coordination Interventions Activated:  Yes.   Care Coordination Interventions:  Yes,  provided.   Follow up plan: Follow up call scheduled for 11/17/2021 at 10:00 am.  Encounter Outcome:  Pt. Visit Completed.   Cristina Harris, BSW, MSW, LCSW  Licensed Education officer, environmental Health System  [place]ailing [place] [place], [place] [address] Physical Address-[address] [place], [place] [address] Toll Free Main # [phone] Fax # [phone] Cell # [phone] [email or web address]

## 2021-11-03 NOTE — Patient Instructions (Signed)
Visit Information  Thank you for taking time to visit with me today. Please don't hesitate to contact me if I can be of assistance to you.   Following are the goals we discussed today:   Goals Addressed               This Visit's Progress     Find Help in My Community. (pt-stated)   On track     Care Coordination Interventions:   Reviewed Information on How to Apply for Adult Medicaid, Through the [place]. Reviewed 2023 Medicaid Tips to Ensure Eligibility. Submit Completed and Signed Medicaid Application to the [place] for Processing.   Review List of Emergency Assistance Programs, Mailed on 11/03/2021. Complete [place] Financial Assistance Application and Submit to [facility] Department for Processing.       Reduce and Manage Symptoms of Depression. (pt-stated)   On track     Care Coordination Interventions:  Deep Breathing Exercises, Relaxation Techniques & Mindfulness Meditation Strategies Reviewed & Encouraged Daily.   Active Listening/Reflection Utilized.  Emotional Support Provided. Verbalization of Feelings Encouraged.  Review the Following List of Counseling Agencies and Resources, Emailed on 10/19/2021 and Mailed on 11/03/2021: ~ [place] ~ [place] ~ Marriage, Relationship, Individual and Family Counseling Services ~ Counseling Agencies in [place] to Discuss the List of [employer] and Resources Provided and Decide on [place].            Our next appointment is by telephone on 11/17/2021 at 10:00 am.  Please call the care guide team at [phone] if you need to cancel or reschedule your appointment.   If you are experiencing a Mental Health or [place] or need someone to talk to, please call the Suicide and Crisis Lifeline: 988 call the [place] National Suicide  Prevention Lifeline: 812-386-8535 or TTY: 7875658663 TTY 203-568-0965) to talk to a trained counselor call 1-800-273-TALK (toll free, 24 hour hotline) go to [facility] [address] [place] [phone]) call the [place]: [phone] call 911  Patient verbalizes understanding of instructions and care plan provided today and agrees to view in [place]. Active MyChart status and patient understanding of how to access instructions and care plan via MyChart confirmed with patient.     Telephone follow up appointment with care management team member scheduled for:  11/17/2021 at 10:00 am.  Nat Christen, BSW, MSW, [place]  Licensed Clinical Social Worker  Tool  Mailing [place]. [address], [place], [place] [address] Physical Address-[address], [place], [place] [address] Toll Free Main # [phone] Fax # [phone] Cell # [phone] [email or web address]

## 2021-11-07 ENCOUNTER — Encounter: Payer: Self-pay | Admitting: *Deleted

## 2021-11-07 ENCOUNTER — Ambulatory Visit: Payer: Self-pay | Admitting: *Deleted

## 2021-11-07 DIAGNOSIS — Z659 Problem related to unspecified psychosocial circumstances: Secondary | ICD-10-CM | POA: Insufficient documentation

## 2021-11-07 NOTE — Patient Outreach (Addendum)
  Care Coordination   Follow Up Visit Note   11/07/2021 Name: Cristina Harris MRN: [id number] DOB: [birth year]  Cristina Harris is a 56 y.o. year old female who sees Cristina Harris, Cristina Nordmann, MD for primary care. I spoke with  Cristina Harris by phone today.  What matters to the patients health and wellness today?  Will speak with doctor about weight management Benzphetamine (Didrex), Diethylpropion (Tenuate), Liraglutide (Saxenda), Naltrexone and bupropion (Contrave) Orlistat (Xenical, Alli), Phendimetrazine tartrate   Phentermine (Adipex-P), and Phentermine and topiramate (Qsymia)  Release to go back to work  She states her vocational rehab will be paid for  She is now only lifting 15-25 pounds Work as Quarry manager.    Hypertension 140/80 goal She confirms she is having elevations related to pain   Positive home fecal occult blood test but denies hemorrhoids Colonoscopy 12/05/21 Questions answered  Dental She states she feels her poor dentition is a cause of various medical concerns. 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  Provide patient with a list of local [place] dentist, List of free or reduced local dentists, List of Cristina Harris glucose & continuous glucose preferred brands  via my chart Emailed the patient and shared with her pcp that her insurance policy coverage does not support injections for weight loss, but devices, diet, exercise and a list of oral medicines.  Education provided  via my chart for periodontal disease, preventive dental care, Oral health tips, peptic ulcer, stool for occult blood test, hemorrhoids Sent EPIC in basket message to pcp with list of local [place] dentist, List of free or reduced local dentists, List of Cristina Harris glucose & continuous glucose preferred brands to assist with patient orders/prescriptions                   manage pain of both hands (THN) (pt-stated)   On track     Care Coordination Interventions: Assessed social determinant of health barriers Assessed pain  Active listening and encouragement provided        SDOH assessments and interventions completed:  Yes  SDOH Interventions Today    Flowsheet Row Most Recent Value  SDOH Interventions   Food Insecurity Interventions Intervention Not Indicated  Transportation Interventions Intervention Not Indicated  Utilities Interventions Intervention Not Indicated        Care Coordination Interventions Activated:  Yes  Care Coordination Interventions:  Yes, provided   Follow up plan: Follow up call scheduled for 12/08/21    Encounter Outcome:  Pt. Visit Completed   [name] L. Lavina Hamman, RN, BSN, [place] Coordinator Office number [phone]

## 2021-11-07 NOTE — Patient Instructions (Addendum)
Visit Information  Thank you for taking time to visit with me today. Please don't hesitate to contact me if I can be of assistance to you.   Glucose & Continuous monitors preferred brands for [facility] [email or web address]_Exchange_[email or web address] The regular finger stick meters are: accu chek aviva plus in vitro, accu chek guide in vitro, accu chek smartview in vitro The continuous meters are: Dexcom G6 & G7  Listed below are links to organizations that offer free or reduced cost dental care:  [place]: [place] - agapedentalministry.[place]: [facility] - WorkplaceHistory.com.br  [place]: [place] [employer] - FindDrives.pl  [place] Locations: [place], [place],  [place], [place], [place], [place], [place] - [email or web address] Dental cleanings may be available at your local community college for a small fee. In the dental  hygiene program, patients are needed for student practice. To find out if there is a church or organization in your area sponsoring the [place] on [employer], email: [email or web address]. Be sure to include the city and county in  which you need services. Not all of the clinics are open to the general public. The sponsoring  church/organization enlists local dentists to volunteer aboard the mobile units  Did not see dental coverage on your insurance card BUT These are some dentists locally who accept [facility] Dental Dr Lannie [place] [phone] [place] [phone] [place] [phone] [place] [facility] in [place] [phone] [facility] [address] [place] [phone]   Following are the goals we discussed today:   Goals Addressed               This Visit's Progress     Patient Stated     Find community resource for counseling [facility]) (pt-stated)   On track     Care Coordination Interventions: Discussed plans with patient for ongoing care management follow up and provided patient with direct contact information for care management team Active listening, support ,  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  Provide patient with a list of local [place] dentist, List of free or reduced local dentists, List of [employer]etna glucose & continuous glucose preferred brands via my chart Emailed the patient and shared with her pcp that her insurance policy coverage does not support injections for weight loss, but devices, diet, exercise and a list of oral medicines.  Education provided  via my chart for periodontal disease, preventive dental care, Oral health tips, peptic ulcer, stool for occult blood test, hemorrhoids Sent EPIC in basket message to pcp with list of local [place] dentist, List of free or reduced local dentists, List of Aetna glucose & continuous glucose preferred brands to assist with patient orders/prescriptions      Find Help in My Community. (pt-stated)   On track  Care Coordination Interventions:   Reviewed Information on How to Apply for Adult Medicaid, Through the [place]. Reviewed 2023 Medicaid Tips to Ensure Eligibility. Submit Completed and Signed Medicaid Application to the [place] for Processing.   Review List of Emergency  Assistance Programs, Mailed on 11/03/2021. Complete [place] Financial Assistance Application and Submit to [facility] Department for Processing.       manage pain of both hands (THN) (pt-stated)   On track     Care Coordination Interventions: Assessed social determinant of health barriers Assessed pain  Active listening and encouragement provided        Our next appointment is by telephone on 12/08/21 at 1100  Please call the care guide team at [phone] if you need to cancel or reschedule your appointment.   If you are experiencing a Mental Health or [place] or need someone to talk to, please call the Suicide and Crisis Lifeline: 988 call the [place] National Suicide Prevention Lifeline: 512-364-4718 or TTY: 310-392-4949 TTY (202)792-5081) to talk to a trained counselor call 1-800-273-TALK (toll free, 24 hour hotline) call the [facility]: [phone] call 911   Patient verbalizes understanding of instructions and care plan provided today and agrees to view in [place]. Active MyChart status and patient understanding of how to access instructions and care plan via MyChart confirmed with patient.     The patient has been provided with contact information for the care management team and has been advised to call with any health related questions or concerns.   [place] Lavina Hamman, RN, BSN, [place] Coordinator Office number [phone]

## 2021-11-10 ENCOUNTER — Telehealth: Payer: Self-pay | Admitting: *Deleted

## 2021-11-10 NOTE — Patient Instructions (Addendum)
Visit Information  Thank you for taking time to visit with me today. Please don't hesitate to contact me if I can be of assistance to you.   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)        Care Coordination Interventions: Discussed plans with patient for ongoing care management follow up and provided patient with direct contact information for care management team Active listening, support ,  Encouragement provided Returned a call to patient after she confirmed no outreach from pcp office related to weight loss injection/pill. Found the 11/08/21 e-mail sent to her, verified the e-mail address with her (correct), reviewed with her that in RN CM search for [employer] [facility]S health in network weight loss program, It was found that the injections are not covered, only the pills, Reviewed the medicines in the e-mail to include Benzphetamine, phentermine, diethylpropion, phendimetrazine RN CM had an unsuccessful outreach to the pcp office as the office closed at 4 pm. No message able to be left. RN CM will make another attempt         Our next appointment is by telephone on 12/08/21 at 1100  Please call the care guide team at [phone] if you need to cancel or reschedule your appointment.   If you are experiencing a Mental Health or [place] or need someone to talk to, please call the Suicide and Crisis Lifeline: 988 call the [place] National Suicide Prevention Lifeline: 773-385-8823 or TTY: 715-066-8029 TTY 680-599-0919) to talk to a trained counselor call 1-800-273-TALK (toll free, 24 hour hotline) call the [facility]: ([phone] call 911   Patient verbalizes understanding of instructions and care plan provided today and agrees to view in [place]. Active MyChart status and patient understanding of how to  access instructions and care plan via MyChart confirmed with patient.     The patient has been provided with contact information for the care management team and has been advised to call with any health related questions or concerns.   [place] Lavina Hamman, RN, BSN, [place] Coordinator Office number [phone]

## 2021-11-10 NOTE — Patient Outreach (Signed)
  Care Coordination   Return call to patient  Visit Note   11/10/2021 Name: Kialee Kham MRN: [id number] DOB: May 20, 1965  Charletha Dalpe is a 56 y.o. year old female who sees Doren Custard, Hazle Nordmann, MD for primary care. I spoke with  [name] by phone today.  What matters to the patients health and wellness today?  E-mail for in network weight loss medicine   Patient to re check her e-mails and my chart   Goals Addressed               This Visit's Progress     Patient Stated     Research officer, trade union for counseling, weight loss (THN) (pt-stated)        Care Coordination Interventions: Discussed plans with patient for ongoing care management follow up and provided patient with direct contact information for care management team Active listening, support ,  Encouragement provided Returned a call to patient after she confirmed no outreach from pcp office related to weight loss injection/pill. Found the 11/08/21 e-mail sent to her, verified the e-mail address with her (correct), reviewed with her that in RN CM search for [employer] [facility]S health in network weight loss program, It was found that the injections are not covered, only the pills, Reviewed the medicines in the e-mail to include Benzphetamine, phentermine, diethylpropion, phendimetrazine RN CM had an unsuccessful outreach to the pcp office as the office closed at 4 pm. No message able to be left. RN CM will make another attempt         SDOH assessments and interventions completed:  No     Care Coordination Interventions Activated:  Yes  Care Coordination Interventions:  Yes, provided   Follow up plan: Follow up call scheduled for 12/08/21    Encounter Outcome:  Pt. Visit Completed   Padraic Marinos L. Lavina Hamman, RN, BSN, [place] Coordinator Office number [phone]

## 2021-11-14 ENCOUNTER — Telehealth: Payer: Self-pay | Admitting: *Deleted

## 2021-11-14 NOTE — Patient Outreach (Signed)
  Care Coordination   Collaboration with   Visit Note   11/14/2021 Name: Cristina Harris MRN: [id number] DOB: 1965-07-09  Cristina Harris is a 56 y.o. year old female who sees Doren Custard, Hazle Nordmann, MD for primary care. I  left a message at the pcp office related to patient request for oral medicine to assist with weight loss. Office closed    What matters to the patients health and wellness today?  Request for prescription for oral weight loss medicine(covered by her insurance is benzphetamine, diethylpropion, phendimetrazine, phentermine collaboration with pcp office     Goals Addressed               This Visit's Progress     Patient Stated     Find community resource for counseling, weight loss (THN) (pt-stated)        Care Coordination Interventions: Discussed plans with patient for ongoing care management follow up and provided patient with direct contact information for care management team Active listening, support ,  Encouragement provided RN CM had an unsuccessful outreach to the pcp office as the office closed at 4 pm. A voice message was left to include patient and RN CM's follow up numbers.        SDOH assessments and interventions completed:  No     Care Coordination Interventions Activated:  Yes  Care Coordination Interventions:  Yes, provided   Follow up plan: Follow up call scheduled for 12/08/21    Encounter Outcome:  Pt. Visit Completed   Cristina Okray L. Lavina Hamman, RN, BSN, [place] Coordinator Office number [phone]

## 2021-11-17 ENCOUNTER — Ambulatory Visit: Payer: Self-pay | Admitting: *Deleted

## 2021-11-17 ENCOUNTER — Encounter: Payer: Self-pay | Admitting: *Deleted

## 2021-11-17 NOTE — Patient Instructions (Signed)
Visit Information  Thank you for taking time to visit with me today. Please don't hesitate to contact me if I can be of assistance to you.   Following are the goals we discussed today:   Goals Addressed               This Visit's Progress     Find Help in My Community. (pt-stated)   On track     Care Coordination Interventions:  Await Approval/Denial Letter from the [place] for Adult Medicaid.   Reviewed List of Emergency Assistance Programs and Encouraged Patient to [employer] and Resources of Interest. Await Approval/Denial Letter from the [place] for [place].  Congratulations on Approval for Section 8 Housing through the [place] ~ [place] on 11/17/2021 and [facility] on 11/19/2021. Continue to Await Disability Settlement Hearing, Scheduled on 12/20/2021 Regarding Social Security Disability Coverage.       Reduce and Manage Symptoms of Depression. (pt-stated)   On track     Care Coordination Interventions:  Deep Breathing Exercises, Relaxation Techniques & Mindfulness Meditation Strategies Encouraged Daily.   Active Listening/Reflection Performed.  Emotional Support Provided. Verbalization of Feelings Encouraged. Problem-Solving/Task-Centered Interventions Employed.  Reviewed the Following List of Counseling Agencies and Resources:   ~ [place] ~ [place] ~ Marriage, Relationship, Individual and Family Counseling Services ~ Counseling Agencies in [place] Encouraged Patient to [employer] and Resources of Interest, in An Effort to [employer].           Our next appointment is by telephone on 12/01/2021 at 10:30 am.  Please call the care guide team  at [phone] if you need to cancel or reschedule your appointment.   If you are experiencing a Mental Health or [place] or need someone to talk to, please call the Suicide and Crisis Lifeline: 988 call the [place] National Suicide Prevention Lifeline: 564-576-4485 or TTY: 4187413567 TTY 413-091-4998) to talk to a trained counselor call 1-800-273-TALK (toll free, 24 hour hotline) go to [facility] [address], [place] [phone]) call the [place]: [phone] call 911  Patient verbalizes understanding of instructions and care plan provided today and agrees to view in [place]. Active MyChart status and patient understanding of how to access instructions and care plan via MyChart confirmed with patient.     Telephone follow up appointment with care management team member scheduled for:  12/01/2021 at 10:30 am.  Nat Christen, BSW, MSW, [place]  Licensed Clinical Social Worker  [place]  Mailing [place]. [address], [place], [place] [address] Physical Address-[address], [place], [place] [address] Toll Free Main # [phone] Fax # [phone] Cell # [phone] [email or web address]

## 2021-11-17 NOTE — Patient Outreach (Signed)
  Care Coordination   Follow Up Visit Note   11/17/2021  Name: Cristina Harris MRN: [id number] DOB: Jul 01, 1965  Cristina Harris is a 56 y.o. year old female who sees Doren Custard, Hazle Nordmann, MD for primary care. I spoke with Cristina Harris by phone today.  What matters to the patients health and wellness today?  Find Help in My Community and Reduce and Manage Symptoms of Depression.    Goals Addressed               This Visit's Progress     Find Help in My Community. (pt-stated)   On track     Care Coordination Interventions:  Await Approval/Denial Letter from the [place] for Adult Medicaid.   Reviewed List of Emergency Assistance Programs and Encouraged Patient to [employer] and Resources of Interest. Await Approval/Denial Letter from the [place] for [place].  Congratulations on Approval for Section 8 Housing through the [place] ([place]). ~ [place] on 11/17/2021 and [facility] [facility] on 11/19/2021. Continue to Await Disability Settlement Hearing, Scheduled on 12/20/2021 Regarding Social Security Disability Coverage.       Reduce and Manage Symptoms of Depression. (pt-stated)   On track     Care Coordination Interventions:  Deep Breathing Exercises, Relaxation Techniques & Mindfulness Meditation Strategies Encouraged Daily.   Active Listening/Reflection Performed.  Emotional Support Provided. Verbalization of Feelings Encouraged. Problem-Solving/Task-Centered Interventions Employed.  Reviewed the Following List of Counseling Agencies and Resources:   ~ [place] ~ [place] ~ Marriage, Relationship, Individual and Family Counseling Services ~ Counseling Agencies in [place] Encouraged Patient to [employer] and Resources of Interest, in An Effort to [employer].         SDOH assessments and interventions completed:  Yes.   Care Coordination Interventions Activated:  Yes.    Care Coordination Interventions:  Yes, provided.    Follow up plan: Follow up call scheduled for 12/01/2021 at 10:30 am.   Encounter Outcome:  Pt. Visit Completed.    Nat Christen, BSW, MSW, LCSW  Licensed Education officer, environmental Health System  Mailing [place] [address] [place], [place] [address] Physical Address-[address] [place], [place] [address] Toll Free Main # [phone] Fax # [phone] Cell # [phone] [email or web address]

## 2021-11-21 ENCOUNTER — Ambulatory Visit
Admission: RE | Admit: 2021-11-21 | Discharge: 2021-11-21 | Disposition: A | Discharge status: Home / Self Care | Payer: 59 | Source: Ambulatory Visit | Attending: Internal Medicine | Admitting: Internal Medicine

## 2021-11-21 DIAGNOSIS — Z1231 Encounter for screening mammogram for malignant neoplasm of breast: Secondary | ICD-10-CM | POA: Diagnosis not present

## 2021-11-28 ENCOUNTER — Ambulatory Visit (INDEPENDENT_AMBULATORY_CARE_PROVIDER_SITE_OTHER): Payer: 59 | Admitting: Internal Medicine

## 2021-11-28 ENCOUNTER — Encounter: Payer: Self-pay | Admitting: Internal Medicine

## 2021-11-28 VITALS — BP 149/95 | HR 87 | Ht 64.0 in | Wt 224.4 lb

## 2021-11-28 DIAGNOSIS — I10 Essential (primary) hypertension: Secondary | ICD-10-CM

## 2021-11-28 DIAGNOSIS — R195 Other fecal abnormalities: Secondary | ICD-10-CM | POA: Diagnosis not present

## 2021-11-28 DIAGNOSIS — E669 Obesity, unspecified: Secondary | ICD-10-CM | POA: Diagnosis not present

## 2021-11-28 DIAGNOSIS — E785 Hyperlipidemia, unspecified: Secondary | ICD-10-CM | POA: Insufficient documentation

## 2021-11-28 DIAGNOSIS — E782 Mixed hyperlipidemia: Secondary | ICD-10-CM

## 2021-11-28 DIAGNOSIS — E559 Vitamin D deficiency, unspecified: Secondary | ICD-10-CM | POA: Diagnosis not present

## 2021-11-28 DIAGNOSIS — J4520 Mild intermittent asthma, uncomplicated: Secondary | ICD-10-CM | POA: Diagnosis not present

## 2021-11-28 MED ORDER — AMLODIPINE BESYLATE 5 MG PO TABS: 5.0000 mg | ORAL_TABLET | Freq: Every day | ORAL | 1 refills | Status: DC

## 2021-11-28 NOTE — Assessment & Plan Note (Signed)
Vitamin D level improved to 20.9 when repeated last month, however she remained borderline deficient.  A prescription for weekly high-dose supplementation was ordered.  There was a month-long delay in her filling the prescription, however she is now on weekly high-dose supplementation. -Repeat vitamin D level upon completion of high-dose supplementation

## 2021-11-28 NOTE — Assessment & Plan Note (Signed)
Positive Cologuard from 9/19.  She was referred to gastroenterology for screening colonoscopy.  This is now scheduled for 11/30.

## 2021-11-28 NOTE — Progress Notes (Signed)
Established Patient Office Visit  Subjective   Patient ID: Cristina Harris, female    DOB: Dec 25, 1965  Age: 56 y.o. MRN: [id number]  Chief Complaint  Patient presents with   Follow-up    Wants to talk about weight loss    Ms. Cristina Harris returns to care today.  She is a 56 year old woman with a past medical history significant for asthma, carpal tunnel syndrome, obesity, vitamin D deficiency, and elevated BP.  She was last seen by me on 10/17/2021 at which time her albuterol inhaler was refilled and her blood pressure was again elevated.  She also wanted to discuss weight loss.  Vitamin D level was checked and she remained borderline vitamin D deficient.  Repeat high-dose supplementation was ordered.  Cologuard ordered at her last appointment has since resulted positive and she was referred for screening colonoscopy.  Today Cristina Harris is feeling well.  She endorses chronic lumbar back pain but is otherwise asymptomatic.  Her acute concern today is wanting to lose weight.  She is interested in starting a medication for weight loss.  She has spoken with a nurse from care coordination, who has emailed me a list of preferred medications covered by her insurance.  Cristina Harris states that she is not exercising currently because she is in the process of moving. She is currently on high-dose vitamin D supplementation and is scheduled for colonoscopy on 11/30 in light of a positive Cologuard test.  Past Medical History:  Diagnosis Date   Carpal tunnel syndrome on both sides    Past Surgical History:  Procedure Laterality Date   CARPAL TUNNEL RELEASE Right 2022   NASAL FRACTURE SURGERY     TUBAL LIGATION     WRIST ARTHROSCOPY Right 2022   Social History   Tobacco Use   Smoking status: Former    Types: Cigarettes    Quit date: 04/2021    Years since quitting: 0.6    Passive exposure: Past   Smokeless tobacco: Never   Tobacco comments:    Started smoking at age 67, quit smoking 2023.    Vaping Use   Vaping Use: Former  Substance Use Topics   Alcohol use: Yes    Comment: occ   Drug use: No   Family History  Problem Relation Age of Onset   Breast cancer Mother    Heart failure Father    Diabetes type II Father    Heart failure Brother    Lupus Daughter    Diabetes type II Maternal Grandmother    Colon cancer Neg Hx    Cervical cancer Neg Hx    No Known Allergies  Review of Systems  Musculoskeletal:  Positive for back pain (Chronic lumbar back pain).  All other systems reviewed and are negative.    Objective:     BP (!) 149/95   Pulse 87   Ht $R'5\' 4"'iq$  (1.626 m)   Wt 224 lb 6.4 oz (101.8 kg)   LMP 03/06/2013   SpO2 97%   BMI 38.52 kg/m  BP Readings from Last 3 Encounters:  11/28/21 (!) 149/95  10/17/21 134/82  08/02/21 128/84   Physical Exam Vitals reviewed.  Constitutional:      General: She is not in acute distress.    Appearance: Normal appearance. She is obese. She is not toxic-appearing.  HENT:     Head: Normocephalic and atraumatic.     Nose: Nose normal. No congestion or rhinorrhea.     Mouth/Throat:     Mouth:  Mucous membranes are moist.     Pharynx: Oropharynx is clear. No oropharyngeal exudate or posterior oropharyngeal erythema.  Eyes:     General: No scleral icterus.    Extraocular Movements: Extraocular movements intact.     Conjunctiva/sclera: Conjunctivae normal.     Pupils: Pupils are equal, round, and reactive to light.  Cardiovascular:     Rate and Rhythm: Normal rate and regular rhythm.     Pulses: Normal pulses.     Heart sounds: Normal heart sounds. No murmur heard.    No friction rub. No gallop.  Pulmonary:     Effort: Pulmonary effort is normal. No respiratory distress.     Breath sounds: Normal breath sounds. No wheezing, rhonchi or rales.  Abdominal:     General: Abdomen is flat. Bowel sounds are normal. There is no distension.     Palpations: Abdomen is soft.     Tenderness: There is no abdominal tenderness.   Musculoskeletal:        General: No swelling or deformity.     Cervical back: Normal range of motion.     Right lower leg: No edema.     Left lower leg: No edema.  Skin:    General: Skin is warm and dry.     Capillary Refill: Capillary refill takes less than 2 seconds.     Coloration: Skin is not jaundiced.  Neurological:     General: No focal deficit present.     Mental Status: She is alert and oriented to person, place, and time.     Motor: No weakness.     Gait: Gait normal.  Psychiatric:        Mood and Affect: Mood normal.        Behavior: Behavior normal.        Thought Content: Thought content normal.        Judgment: Judgment normal.    Last CBC Lab Results  Component Value Date   WBC 5.9 07/11/2021   HGB 12.8 07/11/2021   HCT 37.6 07/11/2021   MCV 90 07/11/2021   MCH 30.7 07/11/2021   RDW 13.0 07/11/2021   PLT 374 07/11/2021   Last metabolic panel Lab Results  Component Value Date   GLUCOSE 86 07/11/2021   NA 145 (H) 07/11/2021   K 4.3 07/11/2021   CL 107 (H) 07/11/2021   CO2 24 07/11/2021   BUN 13 07/11/2021   CREATININE 0.89 07/11/2021   EGFR 76 07/11/2021   CALCIUM 9.3 07/11/2021   PROT 7.6 07/11/2021   ALBUMIN 4.1 07/11/2021   LABGLOB 3.5 07/11/2021   AGRATIO 1.2 07/11/2021   BILITOT 0.4 07/11/2021   ALKPHOS 72 07/11/2021   AST 13 07/11/2021   ALT 21 07/11/2021   ANIONGAP 4 (L) 07/06/2016   Last lipids Lab Results  Component Value Date   CHOL 220 (H) 07/11/2021   HDL 60 07/11/2021   LDLCALC 148 (H) 07/11/2021   TRIG 71 07/11/2021   CHOLHDL 3.7 07/11/2021   Last hemoglobin A1c Lab Results  Component Value Date   HGBA1C 5.5 07/11/2021   Last thyroid functions Lab Results  Component Value Date   TSH 1.000 07/11/2021   Last vitamin D Lab Results  Component Value Date   VD25OH 20.9 (L) 10/17/2021   The 10-year ASCVD risk score (Arnett DK, et al., 2019) is: 9.3%    Assessment & Plan:   Problem List Items Addressed This Visit        Benign hypertension  BP 149/95 today.  No interval readings available for review.  She endorses a family history of hypertension. -Through shared decision making, she is in agreement with starting amlodipine 5 mg daily today.  We will follow-up in 2 weeks for BP check.      Asthma    Albuterol refilled at her last appointment.  Asymptomatic today.  Unremarkable pulmonary exam.      Obesity (BMI 30-39.9)    She is interested in starting a medication for weight loss.  She previously made significant lifestyle modifications aimed at weight loss and was exercising regularly at the [facility], however she reports that she is not exercising currently due to a recent move.  I received an email from community care coordination with weight loss medications covered by her insurance. -Ms. Spiewak will follow-up in 2 weeks for BP check.  I have reviewed the medications covered by her insurance.  They are intended for short-term use in conjunction with lifestyle modifications and weight loss, including dietary changes and exercise.  We can discuss starting phentermine at follow-up, but she needs to resume previous lifestyle changes while taking the medication.  This will be further emphasized at follow-up in 2 weeks.      Vitamin D deficiency    Vitamin D level improved to 20.9 when repeated last month, however she remained borderline deficient.  A prescription for weekly high-dose supplementation was ordered.  There was a month-long delay in her filling the prescription, however she is now on weekly high-dose supplementation. -Repeat vitamin D level upon completion of high-dose supplementation      Positive colorectal cancer screening using Cologuard test    Positive Cologuard from 9/19.  She was referred to gastroenterology for screening colonoscopy.  This is now scheduled for 11/30.      Hyperlipidemia    Total cholesterol 220, LDL 148 on lipid panel from June.  Her 10-year ASCVD risk is  9.3%. -Discussed dietary changes and consider starting statin therapy at follow-up in 2 weeks      Return in about 2 weeks (around 12/12/2021).    Johnette Abraham, MD

## 2021-11-28 NOTE — Assessment & Plan Note (Signed)
Albuterol refilled at her last appointment.  Asymptomatic today.  Unremarkable pulmonary exam.

## 2021-11-28 NOTE — Assessment & Plan Note (Signed)
She is interested in starting a medication for weight loss.  She previously made significant lifestyle modifications aimed at weight loss and was exercising regularly at the [facility], however she reports that she is not exercising currently due to a recent move.  I received an email from community care coordination with weight loss medications covered by her insurance. -Ms. Riccardi will follow-up in 2 weeks for BP check.  I have reviewed the medications covered by her insurance.  They are intended for short-term use in conjunction with lifestyle modifications and weight loss, including dietary changes and exercise.  We can discuss starting phentermine at follow-up, but she needs to resume previous lifestyle changes while taking the medication.  This will be further emphasized at follow-up in 2 weeks.

## 2021-11-28 NOTE — Assessment & Plan Note (Signed)
BP 149/95 today.  No interval readings available for review.  She endorses a family history of hypertension. -Through shared decision making, she is in agreement with starting amlodipine 5 mg daily today.  We will follow-up in 2 weeks for BP check.

## 2021-11-28 NOTE — Patient Instructions (Signed)
It was a pleasure to see you today.  Thank you for giving [place] the opportunity to be involved in your care.  Below is a brief recap of your visit and next steps.  We will plan to see you again in 2 weeks.  Summary We will start amlodipine 5 mg daily for blood pressure. Please check your blood pressure at home and we will follow up in 2 weeks for a blood pressure check. I will research which medicine is most appropriate for your for weight loss based on your insurance.  Next steps Follow up in 2 weeks.

## 2021-11-28 NOTE — Assessment & Plan Note (Signed)
Total cholesterol 220, LDL 148 on lipid panel from June.  Her 10-year ASCVD risk is 9.3%. -Discussed dietary changes and consider starting statin therapy at follow-up in 2 weeks

## 2021-12-01 ENCOUNTER — Ambulatory Visit: Payer: Self-pay | Admitting: *Deleted

## 2021-12-01 ENCOUNTER — Encounter: Payer: Self-pay | Admitting: *Deleted

## 2021-12-01 NOTE — Patient Outreach (Signed)
  Care Coordination   Follow Up Visit Note   12/01/2021  Name: Cristina Harris MRN: [id number] DOB: 1965/05/07  Cristina Harris is a 56 y.o. year old female who sees Doren Custard, Hazle Nordmann, MD for primary care. I spoke with Cristina Harris by phone today.  What matters to the patients health and wellness today?  Find Help in My Community and Reduce and Manage Symptoms of Depression.    Goals Addressed               This Visit's Progress     COMPLETED: Find Help in My Community. (pt-stated)   On track     Care Coordination Interventions:  Await Approval/Denial Letter from the [place] for Adult Medicaid.    ~ Approved for [facility] Coverage. Await Approval/Denial Letter from the [place] for [facility].   ~ Approved for Percentage of Medical Expenses to Be Written Off. Continue to Await Disability Settlement Hearing, Scheduled on 12/20/2021 Regarding Social Security Disability Coverage.       COMPLETED: Reduce and Manage Symptoms of Depression. (pt-stated)   On track     Care Coordination Interventions:  Active Listening Utilized.    Emotional Support Provided. Verbalization of Feelings Encouraged. Task-Centered Interventions Employed.  Encouraged Patient to Contact CSW Directly, If Additional Social Work Needs Are Identified in the Near Future, or If Patient Changes Her Mind About Wishing to [employer] and [employer].        SDOH assessments and interventions completed:  Yes.  Care Coordination Interventions Activated:  Yes.   Care Coordination Interventions:  Yes, Provided.   Follow up plan: No Further Intervention Needed.  Encounter Outcome:  Pt. Visit Completed.   Nat Christen, BSW, MSW, LCSW  Licensed Education officer, environmental Health  System  Mailing [place] [address] [place], [place] [address] Physical Address-[address] [place], [place] [address] Toll Free Main # [phone] Fax # [phone] Cell # [phone] [email or web address]

## 2021-12-01 NOTE — Patient Instructions (Signed)
Visit Information  Thank you for taking time to visit with me today. Please don't hesitate to contact me if I can be of assistance to you.   Following are the goals we discussed today:   Goals Addressed               This Visit's Progress     COMPLETED: Find Help in My Community. (pt-stated)   On track     Care Coordination Interventions:  Await Approval/Denial Letter from the [place] for Adult Medicaid.    ~ Approved for [facility] Coverage. Await Approval/Denial Letter from the [place] for [facility].   ~ Approved for Percentage of Medical Expenses to Be Written Off. Continue to Await Disability Settlement Hearing, Scheduled on 12/20/2021 Regarding Social Security Disability Coverage.       COMPLETED: Reduce and Manage Symptoms of Depression. (pt-stated)   On track     Care Coordination Interventions:  Active Listening Utilized.    Emotional Support Provided. Verbalization of Feelings Encouraged. Task-Centered Interventions Employed.  Encouraged Patient to Contact CSW Directly, If Additional Social Work Needs Are Identified in the Near Future, or If Patient Changes Her Mind About Wishing to [employer] and [employer].        Please call the care guide team at [phone] if you need to cancel or reschedule your appointment.   If you are experiencing a Mental Health or [place] or need someone to talk to, please call the Suicide and Crisis Lifeline: 988 call the [place] National Suicide Prevention Lifeline: (518)006-8571 or TTY: (680) 074-6187 TTY 437-667-5546) to talk to a trained counselor call 1-800-273-TALK (toll free, 24 hour hotline) go to [facility] [address], [place] [phone]) call the [place]:  [phone] call 911  Patient verbalizes understanding of instructions and care plan provided today and agrees to view in [place]. Active MyChart status and patient understanding of how to access instructions and care plan via MyChart confirmed with patient.     No further follow up required.  Nat Christen, BSW, MSW, LCSW  Licensed Education officer, environmental Health System  Mailing [place] [address] [place], [place] [address] Physical Address-[address] [place], [place] [address] Toll Free Main # [phone] Fax # [phone] Cell # [phone] [email or web address]

## 2021-12-03 ENCOUNTER — Other Ambulatory Visit: Payer: Self-pay | Admitting: Internal Medicine

## 2021-12-03 DIAGNOSIS — J45909 Unspecified asthma, uncomplicated: Secondary | ICD-10-CM

## 2021-12-07 ENCOUNTER — Other Ambulatory Visit: Payer: Self-pay | Admitting: Internal Medicine

## 2021-12-07 ENCOUNTER — Encounter: Payer: Self-pay | Admitting: Internal Medicine

## 2021-12-07 ENCOUNTER — Other Ambulatory Visit: Payer: Self-pay

## 2021-12-07 DIAGNOSIS — J45909 Unspecified asthma, uncomplicated: Secondary | ICD-10-CM

## 2021-12-08 ENCOUNTER — Other Ambulatory Visit: Payer: Self-pay

## 2021-12-08 ENCOUNTER — Ambulatory Visit: Payer: Self-pay | Admitting: *Deleted

## 2021-12-08 ENCOUNTER — Telehealth: Payer: Self-pay | Admitting: Internal Medicine

## 2021-12-08 DIAGNOSIS — J45909 Unspecified asthma, uncomplicated: Secondary | ICD-10-CM

## 2021-12-08 MED ORDER — ALBUTEROL SULFATE HFA 108 (90 BASE) MCG/ACT IN AERS: 1.0000 | INHALATION_SPRAY | RESPIRATORY_TRACT | 1 refills | Status: DC | PRN

## 2021-12-08 NOTE — Telephone Encounter (Signed)
Patient called in regard to [facility] message sent.  [facility] [address] [place]   albuterol (VENTOLIN HFA) 108 (90 Base) MCG/ACT inhaler

## 2021-12-08 NOTE — Patient Outreach (Signed)
  Care Coordination   Follow Up Visit Note   12/08/2021 Name: Cristina Harris MRN: [id number] DOB: [birth year]  Cristina Harris is a 57 y.o. year old female who sees Cristina Harris, Cristina Nordmann, MD for primary care. I spoke with  Cristina Harris by phone today.  What matters to the patients health and wellness today?  Has not started on weight loss medicine  Pain of back & hand related to cold Cristina Harris down steps a week fell on stomach and hands  now having numbness of left leg (from knee down) with Swelling  Only obtained scrapes Filed for disability, pending Receive a letter for SSI, encouraged to provide a copy to pcp then take it to the [place] office New address [address] [place] [address] [place] [address]   Goals Addressed               This Visit's Progress     Patient Stated     Research officer, trade union for counseling, weight loss (THN) (pt-stated)   On track     Care Coordination Interventions: Discussed plans with patient for ongoing care management follow up and provided patient with direct contact information for care management team Encouragement provided,  RN CM assessed for initiation of new weight loss medication Confirmed with patient pcp is reviewing and to discuss again at next office visit      manage pain of both hands (THN) (pt-stated)   Not on track     Care Coordination Interventions: Assessed social determinant of health barriers Assessed pain  Assessed recent fall,  Active listening and encouragement provided        SDOH assessments and interventions completed:  No     Care Coordination Interventions Activated:  Yes  Care Coordination Interventions:  Yes, provided   Follow up plan: Follow up call scheduled for 01/05/22    Encounter Outcome:  Pt. Visit Completed   Cristina Harris L. Cristina Hamman, RN, BSN, [place] Coordinator Office number [phone]

## 2021-12-12 DIAGNOSIS — I10 Essential (primary) hypertension: Secondary | ICD-10-CM | POA: Diagnosis not present

## 2021-12-12 DIAGNOSIS — Z803 Family history of malignant neoplasm of breast: Secondary | ICD-10-CM | POA: Diagnosis not present

## 2021-12-12 DIAGNOSIS — G8929 Other chronic pain: Secondary | ICD-10-CM | POA: Diagnosis not present

## 2021-12-12 DIAGNOSIS — Z87891 Personal history of nicotine dependence: Secondary | ICD-10-CM | POA: Diagnosis not present

## 2021-12-12 DIAGNOSIS — Z833 Family history of diabetes mellitus: Secondary | ICD-10-CM | POA: Diagnosis not present

## 2021-12-12 DIAGNOSIS — Z6838 Body mass index (BMI) 38.0-38.9, adult: Secondary | ICD-10-CM | POA: Diagnosis not present

## 2021-12-12 DIAGNOSIS — J45909 Unspecified asthma, uncomplicated: Secondary | ICD-10-CM | POA: Diagnosis not present

## 2021-12-12 DIAGNOSIS — Z8249 Family history of ischemic heart disease and other diseases of the circulatory system: Secondary | ICD-10-CM | POA: Diagnosis not present

## 2021-12-13 ENCOUNTER — Encounter: Payer: Self-pay | Admitting: Internal Medicine

## 2021-12-13 ENCOUNTER — Ambulatory Visit (INDEPENDENT_AMBULATORY_CARE_PROVIDER_SITE_OTHER): Payer: 59 | Admitting: Internal Medicine

## 2021-12-13 VITALS — BP 123/83 | HR 90 | Ht 64.0 in | Wt 220.8 lb

## 2021-12-13 DIAGNOSIS — E669 Obesity, unspecified: Secondary | ICD-10-CM

## 2021-12-13 DIAGNOSIS — I10 Essential (primary) hypertension: Secondary | ICD-10-CM | POA: Diagnosis not present

## 2021-12-13 MED ORDER — PHENTERMINE HCL 15 MG PO CAPS: 15.0000 mg | ORAL_CAPSULE | ORAL | 0 refills | Status: DC

## 2021-12-13 NOTE — Assessment & Plan Note (Signed)
BP 123/83 today.  Amlodipine 5 mg daily was started at her last appointment.  No medication changes today.

## 2021-12-13 NOTE — Progress Notes (Signed)
Established Patient Office Visit  Subjective   Patient ID: Cristina Harris, female    DOB: [birth year]  Age: 56 y.o. MRN: [id number]  Chief Complaint  Patient presents with   Follow-up    Left leg fluid/numb   Cristina Harris returns to care today.  She is a 56 year old woman with a past medical history significant for asthma, carpal tunnel syndrome, obesity, vitamin D deficiency, and HTN.  She was last seen by me on 10/23 at which time amlodipine 5 mg daily was started for treatment of HTN.  We also discussed starting a medication for weight loss, specifically phentermine.  She returns today to further discuss this and for BP check.  There have been no acute interval events.  Cristina Harris states that she feels well today.  She reports resolving pain in her left leg from a recent fall.  She remains interested in starting phentermine for weight loss today and is very motivated to lose weight, noting that this is the most she has ever weighed.  She has no additional concerns to discuss today.  Past Medical History:  Diagnosis Date   Carpal tunnel syndrome on both sides    Past Surgical History:  Procedure Laterality Date   CARPAL TUNNEL RELEASE Right 2022   NASAL FRACTURE SURGERY     TUBAL LIGATION     WRIST ARTHROSCOPY Right 2022   Social History   Tobacco Use   Smoking status: Former    Types: Cigarettes    Quit date: 04/2021    Years since quitting: 0.6    Passive exposure: Past   Smokeless tobacco: Never   Tobacco comments:    Started smoking at age 69, quit smoking 2023.   Vaping Use   Vaping Use: Former  Substance Use Topics   Alcohol use: Yes    Comment: occ   Drug use: No   Family History  Problem Relation Age of Onset   Breast cancer Mother    Heart failure Father    Diabetes type II Father    Heart failure Brother    Lupus Daughter    Diabetes type II Maternal Grandmother    Colon cancer Neg Hx    Cervical cancer Neg Hx    No Known Allergies  Review of  Systems  Constitutional:  Negative for chills and fever.  HENT:  Negative for sore throat.   Respiratory:  Negative for cough and shortness of breath.   Cardiovascular:  Negative for chest pain, palpitations and leg swelling.  Gastrointestinal:  Negative for abdominal pain, blood in stool, constipation, diarrhea, nausea and vomiting.  Genitourinary:  Negative for dysuria and hematuria.  Musculoskeletal:  Negative for myalgias.       Left lower extremity pain from recent fall  Skin:  Negative for itching and rash.  Neurological:  Negative for dizziness and headaches.  Psychiatric/Behavioral:  Negative for depression and suicidal ideas.      Objective:     BP 123/83   Pulse 90   Ht _0  (1.626 m)   Wt 220 lb 12.8 oz (100.2 kg)   LMP 03/06/2013   SpO2 92%   BMI 37.90 kg/m  BP Readings from Last 3 Encounters:  12/13/21 123/83  11/28/21 (!) 149/95  10/17/21 134/82   Physical Exam Vitals reviewed.  Constitutional:      General: She is not in acute distress.    Appearance: Normal appearance. She is obese. She is not toxic-appearing.  HENT:     Head:  Normocephalic and atraumatic.     Nose: Nose normal. No congestion or rhinorrhea.     Mouth/Throat:     Mouth: Mucous membranes are moist.     Pharynx: Oropharynx is clear. No oropharyngeal exudate or posterior oropharyngeal erythema.  Eyes:     General: No scleral icterus.    Extraocular Movements: Extraocular movements intact.     Conjunctiva/sclera: Conjunctivae normal.     Pupils: Pupils are equal, round, and reactive to light.  Cardiovascular:     Rate and Rhythm: Normal rate and regular rhythm.     Pulses: Normal pulses.     Heart sounds: Normal heart sounds. No murmur heard.    No friction rub. No gallop.  Pulmonary:     Effort: Pulmonary effort is normal. No respiratory distress.     Breath sounds: Normal breath sounds. No wheezing, rhonchi or rales.  Abdominal:     General: Abdomen is flat. Bowel sounds are  normal. There is no distension.     Palpations: Abdomen is soft.     Tenderness: There is no abdominal tenderness.  Musculoskeletal:        General: No swelling or deformity.     Cervical back: Normal range of motion.     Right lower leg: No edema.     Left lower leg: No edema.  Skin:    General: Skin is warm and dry.     Capillary Refill: Capillary refill takes less than 2 seconds.     Coloration: Skin is not jaundiced.  Neurological:     General: No focal deficit present.     Mental Status: She is alert and oriented to person, place, and time.     Motor: No weakness.     Gait: Gait normal.  Psychiatric:        Mood and Affect: Mood normal.        Behavior: Behavior normal.        Thought Content: Thought content normal.        Judgment: Judgment normal.    Last CBC Lab Results  Component Value Date   WBC 5.9 07/11/2021   HGB 12.8 07/11/2021   HCT 37.6 07/11/2021   MCV 90 07/11/2021   MCH 30.7 07/11/2021   RDW 13.0 07/11/2021   PLT 374 97/58/8325   Last metabolic panel Lab Results  Component Value Date   GLUCOSE 86 07/11/2021   NA 145 (H) 07/11/2021   K 4.3 07/11/2021   CL 107 (H) 07/11/2021   CO2 24 07/11/2021   BUN 13 07/11/2021   CREATININE 0.89 07/11/2021   EGFR 76 07/11/2021   CALCIUM 9.3 07/11/2021   PROT 7.6 07/11/2021   ALBUMIN 4.1 07/11/2021   LABGLOB 3.5 07/11/2021   AGRATIO 1.2 07/11/2021   BILITOT 0.4 07/11/2021   ALKPHOS 72 07/11/2021   AST 13 07/11/2021   ALT 21 07/11/2021   ANIONGAP 4 (L) 07/06/2016   Last lipids Lab Results  Component Value Date   CHOL 220 (H) 07/11/2021   HDL 60 07/11/2021   LDLCALC 148 (H) 07/11/2021   TRIG 71 07/11/2021   CHOLHDL 3.7 07/11/2021   Last hemoglobin A1c Lab Results  Component Value Date   HGBA1C 5.5 07/11/2021   Last thyroid functions Lab Results  Component Value Date   TSH 1.000 07/11/2021   Last vitamin D Lab Results  Component Value Date   VD25OH 20.9 (L) 10/17/2021    The 10-year  ASCVD risk score (Arnett DK, et al., 2019) is:  5%    Assessment & Plan:   Problem List Items Addressed This Visit       Benign hypertension    BP 123/83 today.  Amlodipine 5 mg daily was started at her last appointment.  No medication changes today.      Obesity (BMI 30-39.9) - Primary    She returns.  To discuss weight loss.  She has made lifestyle modifications and is interested in starting a medication for weight loss.  Her insurance will cover phentermine.  We reviewed that this medication is intended for use in conjunction with lifestyle modifications and weight loss.  She expressed understanding.  I have prescribed phentermine 15 mg to be taken each morning before breakfast x30 tablets.  She will return in 1 month for follow-up to assess her progress.  She will sign a controlled substance agreement today. -Follow-up in 1 month      Relevant Medications   phentermine 15 MG capsule    Return in about 4 weeks (around 01/10/2022).    Johnette Abraham, MD

## 2021-12-13 NOTE — Assessment & Plan Note (Signed)
She returns.  To discuss weight loss.  She has made lifestyle modifications and is interested in starting a medication for weight loss.  Her insurance will cover phentermine.  We reviewed that this medication is intended for use in conjunction with lifestyle modifications and weight loss.  She expressed understanding.  I have prescribed phentermine 15 mg to be taken each morning before breakfast x30 tablets.  She will return in 1 month for follow-up to assess her progress.  She will sign a controlled substance agreement today. -Follow-up in 1 month

## 2021-12-13 NOTE — Patient Instructions (Signed)
It was a pleasure to see you today.  Thank you for giving [place] the opportunity to be involved in your care.  Below is a brief recap of your visit and next steps.  We will plan to see you again in 1 month  Summary We have started phentermine for weight loss. This should be taken each morning before breakfast.  We will follow up in 1 month

## 2021-12-15 ENCOUNTER — Encounter (INDEPENDENT_AMBULATORY_CARE_PROVIDER_SITE_OTHER): Payer: Self-pay | Admitting: Gastroenterology

## 2021-12-22 ENCOUNTER — Encounter: Payer: Self-pay | Admitting: Gastroenterology

## 2021-12-22 NOTE — Patient Instructions (Signed)
Visit Information  Thank you for taking time to visit with me today. Please don't hesitate to contact me if I can be of assistance to you.   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)   On track     Care Coordination Interventions: Discussed plans with patient for ongoing care management follow up and provided patient with direct contact information for care management team Encouragement provided,  RN CM assessed for initiation of new weight loss medication Confirmed with patient pcp is reviewing and to discuss again at next office visit      manage pain of both hands [facility]) (pt-stated)   Not on track     Care Coordination Interventions: Assessed social determinant of health barriers Assessed pain  Assessed recent fall,  Active listening and encouragement provided        Our next appointment is by telephone on 01/05/22 at 1 pm  Please call the care guide team at [phone] if you need to cancel or reschedule your appointment.   If you are experiencing a Mental Health or [place] or need someone to talk to, please call the Suicide and Crisis Lifeline: 988 call the [place] National Suicide Prevention Lifeline: (847)404-7451 or TTY: (410)187-8501 TTY 3301304440) to talk to a trained counselor call 1-800-273-TALK (toll free, 24 hour hotline) call the [facility]: (731)440-1515 call 911   Patient verbalizes understanding of instructions and care plan provided today and agrees to view in [place]. Active MyChart status and patient understanding of how to access instructions and care plan via MyChart confirmed with patient.     The patient has been provided with contact information for the care management team and has been advised to call with any health related questions or concerns.   Alys Dulak L.  Lavina Hamman, RN, BSN, [place] Coordinator Office number [phone]

## 2022-01-05 ENCOUNTER — Ambulatory Visit (INDEPENDENT_AMBULATORY_CARE_PROVIDER_SITE_OTHER): Payer: 59 | Admitting: Gastroenterology

## 2022-01-05 ENCOUNTER — Ambulatory Visit: Payer: Self-pay | Admitting: *Deleted

## 2022-01-05 NOTE — Patient Outreach (Signed)
  Care Coordination   Follow Up Visit Note   07/13/2022 late entry for 01/05/22  Name: Cristina Harris MRN: [id number] DOB: 1965-03-04  Cristina Harris is a 56 y.o. year old female who sees Cristina Harris, Cristina Mellow, MD for primary care. I spoke with  Cristina Harris by phone today.  What matters to the patients health and wellness today?  Doing well with Phentermine Only side effects is a dry mount  She feels she has loss weight was 224 lbs Her hands Went to get her mediation .negotiating a price  Filed for disability If she is denied she will appeal Her medicaid is now full Dentist [facility] 01/11/22 She got her low in apartment    Goals Addressed               This Visit's Progress     Patient Stated     COMPLETED: Find community resource for counseling, weight loss ([facility]) (pt-stated)   On track     Care Coordination Interventions:  Goal completed       COMPLETED: manage pain of both hands & both Lower extremities ([facility]) (pt-stated)        Care Coordination Interventions:   encouragement provided Confirmed disability still pending      COMPLETED: New dental provider [facility]) (pt-stated)   On track     Care Coordination Interventions: Pending [facility] dentist (Dr Cristina Harris ) scheduled appointment on Wednesday April 11 2022 at 2:30 pm & oral surgery visit on 06/30/22         SDOH assessments and interventions completed:  No     Care Coordination Interventions:  Yes, provided   Follow up plan: Follow up call scheduled for 03/21/22    Encounter Outcome:  Pt. Visit Completed    Cristina Harris. Cristina Penner, RN, BSN, CCM [facility] Care Coordinator Office number [phone]

## 2022-01-10 ENCOUNTER — Ambulatory Visit (INDEPENDENT_AMBULATORY_CARE_PROVIDER_SITE_OTHER): Payer: 59 | Admitting: Internal Medicine

## 2022-01-10 ENCOUNTER — Encounter: Payer: Self-pay | Admitting: Internal Medicine

## 2022-01-10 VITALS — BP 127/85 | HR 84 | Ht 64.0 in | Wt 219.4 lb

## 2022-01-10 DIAGNOSIS — E559 Vitamin D deficiency, unspecified: Secondary | ICD-10-CM

## 2022-01-10 DIAGNOSIS — E669 Obesity, unspecified: Secondary | ICD-10-CM | POA: Diagnosis not present

## 2022-01-10 DIAGNOSIS — R195 Other fecal abnormalities: Secondary | ICD-10-CM

## 2022-01-10 MED ORDER — PHENTERMINE HCL 15 MG PO CAPS: 15.0000 mg | ORAL_CAPSULE | ORAL | 0 refills | Status: DC

## 2022-01-10 NOTE — Assessment & Plan Note (Signed)
Recently started on phentermine 15 mg daily. Her weight is down 1 lb. She endorses noticing improvement, specifically noting that she has been able to fit into pants more easily. She is please with her current dose and does not want to make any changes today. -No medication changes today. Continue phentermine 15 mg daily. Refill provided.

## 2022-01-10 NOTE — Assessment & Plan Note (Signed)
Previously referred to GI for screening colonoscopy. She currently has an appointment scheduled for 02/03/22.

## 2022-01-10 NOTE — Patient Instructions (Addendum)
It was a pleasure to see you today.  Thank you for giving [place] the opportunity to be involved in your care.  Below is a brief recap of your visit and next steps.  We will plan to see you again in 1 month.  Summary No medication changes today. I am glad to hear things are going well. We will follow up in 1 month and complete your PAP.

## 2022-01-10 NOTE — Progress Notes (Signed)
Established Patient Office Visit  Subjective   Patient ID: Cristina Harris, female    DOB: Sep 23, 1965  Age: 55 y.o. MRN: [id number]  Chief Complaint  Patient presents with   Follow-up   Cristina Harris returns to care today.  She was last seen by me on 11/7 at which time phentermine 15 mg daily was started for obesity.  She had previously been prescribed Wegovy, but has not been able to fill them due to backorder.  Today Cristina Harris states that she feels well.  She believes that phentermine has been effective in helping her lose weight.  She has been able to fit into pants more easily.  She has lost 1 lb since her last appointment.  Past Medical History:  Diagnosis Date   Carpal tunnel syndrome on both sides    Past Surgical History:  Procedure Laterality Date   CARPAL TUNNEL RELEASE Right 2022   NASAL FRACTURE SURGERY     TUBAL LIGATION     WRIST ARTHROSCOPY Right 2022   Social History   Tobacco Use   Smoking status: Former    Types: Cigarettes    Quit date: 04/2021    Years since quitting: 0.7    Passive exposure: Past   Smokeless tobacco: Never   Tobacco comments:    Started smoking at age 63, quit smoking 2023.   Vaping Use   Vaping Use: Former  Substance Use Topics   Alcohol use: Yes    Comment: occ   Drug use: No   Family History  Problem Relation Age of Onset   Breast cancer Mother    Heart failure Father    Diabetes type II Father    Heart failure Brother    Lupus Daughter    Diabetes type II Maternal Grandmother    Colon cancer Neg Hx    Cervical cancer Neg Hx    No Known Allergies  Review of Systems  Constitutional:  Negative for chills and fever.  HENT:  Negative for sore throat.   Respiratory:  Negative for cough and shortness of breath.   Cardiovascular:  Negative for chest pain, palpitations and leg swelling.  Gastrointestinal:  Negative for abdominal pain, blood in stool, constipation, diarrhea, nausea and vomiting.  Genitourinary:  Negative  for dysuria and hematuria.  Musculoskeletal:  Negative for myalgias.  Skin:  Negative for itching and rash.  Neurological:  Negative for dizziness and headaches.  Psychiatric/Behavioral:  Negative for depression and suicidal ideas.      Objective:     BP 127/85   Pulse 84   Ht _0  (1.626 m)   Wt 219 lb 6.4 oz (99.5 kg)   LMP 03/06/2013   SpO2 97%   BMI 37.66 kg/m  BP Readings from Last 3 Encounters:  01/10/22 127/85  12/13/21 123/83  11/28/21 (!) 149/95   Physical Exam Vitals reviewed.  Constitutional:      General: She is not in acute distress.    Appearance: Normal appearance. She is obese. She is not toxic-appearing.  HENT:     Head: Normocephalic and atraumatic.     Nose: Nose normal. No congestion or rhinorrhea.     Mouth/Throat:     Mouth: Mucous membranes are moist.     Pharynx: Oropharynx is clear. No oropharyngeal exudate or posterior oropharyngeal erythema.  Eyes:     General: No scleral icterus.    Extraocular Movements: Extraocular movements intact.     Conjunctiva/sclera: Conjunctivae normal.     Pupils: Pupils  are equal, round, and reactive to light.  Cardiovascular:     Rate and Rhythm: Normal rate and regular rhythm.     Pulses: Normal pulses.     Heart sounds: Normal heart sounds. No murmur heard.    No friction rub. No gallop.  Pulmonary:     Effort: Pulmonary effort is normal. No respiratory distress.     Breath sounds: Normal breath sounds. No wheezing, rhonchi or rales.  Abdominal:     General: Abdomen is flat. Bowel sounds are normal. There is no distension.     Palpations: Abdomen is soft.     Tenderness: There is no abdominal tenderness.  Musculoskeletal:        General: No swelling or deformity.     Cervical back: Normal range of motion.     Right lower leg: No edema.     Left lower leg: No edema.  Skin:    General: Skin is warm and dry.     Capillary Refill: Capillary refill takes less than 2 seconds.     Coloration: Skin is not  jaundiced.  Neurological:     General: No focal deficit present.     Mental Status: She is alert and oriented to person, place, and time.     Motor: No weakness.     Gait: Gait normal.  Psychiatric:        Mood and Affect: Mood normal.        Behavior: Behavior normal.        Thought Content: Thought content normal.        Judgment: Judgment normal.    Last CBC Lab Results  Component Value Date   WBC 5.9 07/11/2021   HGB 12.8 07/11/2021   HCT 37.6 07/11/2021   MCV 90 07/11/2021   MCH 30.7 07/11/2021   RDW 13.0 07/11/2021   PLT 374 53/97/6734   Last metabolic panel Lab Results  Component Value Date   GLUCOSE 86 07/11/2021   NA 145 (H) 07/11/2021   K 4.3 07/11/2021   CL 107 (H) 07/11/2021   CO2 24 07/11/2021   BUN 13 07/11/2021   CREATININE 0.89 07/11/2021   EGFR 76 07/11/2021   CALCIUM 9.3 07/11/2021   PROT 7.6 07/11/2021   ALBUMIN 4.1 07/11/2021   LABGLOB 3.5 07/11/2021   AGRATIO 1.2 07/11/2021   BILITOT 0.4 07/11/2021   ALKPHOS 72 07/11/2021   AST 13 07/11/2021   ALT 21 07/11/2021   ANIONGAP 4 (L) 07/06/2016   Last lipids Lab Results  Component Value Date   CHOL 220 (H) 07/11/2021   HDL 60 07/11/2021   LDLCALC 148 (H) 07/11/2021   TRIG 71 07/11/2021   CHOLHDL 3.7 07/11/2021   Last hemoglobin A1c Lab Results  Component Value Date   HGBA1C 5.5 07/11/2021   Last thyroid functions Lab Results  Component Value Date   TSH 1.000 07/11/2021   Last vitamin D Lab Results  Component Value Date   VD25OH 20.9 (L) 10/17/2021   The 10-year ASCVD risk score (Arnett DK, et al., 2019) is: 5.6%    Assessment & Plan:   Problem List Items Addressed This Visit       Obesity (BMI 30-39.9)    Recently started on phentermine 15 mg daily. Her weight is down 1 lb. She endorses noticing improvement, specifically noting that she has been able to fit into pants more easily. She is please with her current dose and does not want to make any changes today. -No  medication  changes today. Continue phentermine 15 mg daily. Refill provided.       Vitamin D deficiency - Primary    Currently on high dose, weekly supplementation. Plan to repeat vitamin D level at follow up in 1 month.      Positive colorectal cancer screening using Cologuard test    Previously referred to GI for screening colonoscopy. She currently has an appointment scheduled for 02/03/22.       Return in about 1 month (around 02/10/2022).    Johnette Abraham, MD

## 2022-01-10 NOTE — Assessment & Plan Note (Signed)
Currently on high dose, weekly supplementation. Plan to repeat vitamin D level at follow up in 1 month.

## 2022-01-18 ENCOUNTER — Ambulatory Visit: Payer: 59 | Admitting: Gastroenterology

## 2022-02-02 ENCOUNTER — Telehealth: Payer: Self-pay | Admitting: *Deleted

## 2022-02-02 NOTE — Telephone Encounter (Signed)
Pt had called and left voicemail stating that she had a colonoscopy scheduled for 8:30 am tomorrow. Advised pt that she has an office visit scheduled for tomorrow at 8:30 am, needs to arrive at 8:15 am and she will meet with the provider and if a colonoscopy is ordered then we will schedule it while she is in the office. Pt verbalized understanding.

## 2022-02-03 ENCOUNTER — Encounter: Payer: Self-pay | Admitting: *Deleted

## 2022-02-03 ENCOUNTER — Ambulatory Visit (INDEPENDENT_AMBULATORY_CARE_PROVIDER_SITE_OTHER): Payer: 59 | Admitting: Gastroenterology

## 2022-02-03 ENCOUNTER — Telehealth: Payer: Self-pay | Admitting: *Deleted

## 2022-02-03 ENCOUNTER — Encounter: Payer: Self-pay | Admitting: Gastroenterology

## 2022-02-03 VITALS — BP 123/86 | HR 86 | Temp 98.0°F | Ht 64.0 in | Wt 214.6 lb

## 2022-02-03 DIAGNOSIS — R195 Other fecal abnormalities: Secondary | ICD-10-CM | POA: Diagnosis not present

## 2022-02-03 MED ORDER — PEG 3350-KCL-NA BICARB-NACL 420 G PO SOLR: 4000.0000 mL | Freq: Once | ORAL | 0 refills | Status: AC

## 2022-02-03 NOTE — Progress Notes (Signed)
GI Office Note    Referring Provider: Johnette Abraham, MD Primary Care Physician:  Johnette Abraham, MD  Primary Gastroenterologist: Elon Alas. Abbey Chatters, DO   Chief Complaint   Chief Complaint  Patient presents with   Colonoscopy    Positive cologuard     History of Present Illness   Cristina Harris is a 56 y.o. female presenting today at the request of Dr. Doren Custard for further evaluation of positive Cologuard.  Patient denies issues with chronic constipation, diarrhea, melena, rectal bleeding.  No abdominal pain or upper GI symptoms.  No family history of colon cancer or colon polyps to her knowledge.  Intentional weight loss on phentermine, 2 pounds in the past 2 months.  Recently had her teeth pulled, dentures fitted but requiring adjustments.  Completing amoxicillin.    Medications   Current Outpatient Medications  Medication Sig Dispense Refill   acetaminophen (TYLENOL) 500 MG tablet Take by mouth.     albuterol (VENTOLIN HFA) 108 (90 Base) MCG/ACT inhaler Inhale 1 puff into the lungs as needed for wheezing or shortness of breath. 8.5 each 1   amLODipine (NORVASC) 5 MG tablet Take 1 tablet (5 mg total) by mouth daily. 90 tablet 1   amoxicillin (AMOXIL) 875 MG tablet SMARTSIG:1 Tablet(s) By Mouth Every 12 Hours     ibuprofen (ADVIL) 800 MG tablet Take 800 mg by mouth.     phentermine 15 MG capsule Take 1 capsule (15 mg total) by mouth every morning. 30 capsule 0   No current facility-administered medications for this visit.    Allergies   Allergies as of 02/03/2022   (No Known Allergies)    Past Medical History   Past Medical History:  Diagnosis Date   Carpal tunnel syndrome on both sides     Past Surgical History   Past Surgical History:  Procedure Laterality Date   CARPAL TUNNEL RELEASE Right 2022   NASAL FRACTURE SURGERY     TUBAL LIGATION     WRIST ARTHROSCOPY Right 2022    Past Family History   Family History  Problem Relation Age of Onset    Breast cancer Mother    Heart failure Father    Diabetes type II Father    Heart failure Brother    Lupus Daughter    Diabetes type II Maternal Grandmother    Colon cancer Neg Hx    Cervical cancer Neg Hx     Past Social History   Social History   Socioeconomic History   Marital status: Single    Spouse name: Not on file   Number of children: 5   Years of education: 12   Highest education level: 12th grade  Occupational History   Not on file  Tobacco Use   Smoking status: Former    Types: Cigarettes    Quit date: 04/2021    Years since quitting: 0.8    Passive exposure: Past   Smokeless tobacco: Never   Tobacco comments:    Started smoking at age 64, quit smoking 2023.   Vaping Use   Vaping Use: Former  Substance and Sexual Activity   Alcohol use: Yes    Comment: occ   Drug use: No   Sexual activity: Not Currently    Partners: Male    Birth control/protection: Surgical  Other Topics Concern   Not on file  Social History Narrative   Lives home alone    Was a cna Likes hands on  Social Determinants of Health   Financial Resource Strain: Medium Risk (10/19/2021)   Overall Financial Resource Strain (CARDIA)    Difficulty of Paying Living Expenses: Somewhat hard  Food Insecurity: No Food Insecurity (11/07/2021)   Hunger Vital Sign    Worried About Running Out of Food in the Last Year: Never true    Ran Out of Food in the Last Year: Never true  Transportation Needs: No Transportation Needs (11/07/2021)   PRAPARE - Hydrologist (Medical): No    Lack of Transportation (Non-Medical): No  Physical Activity: Inactive (10/19/2021)   Exercise Vital Sign    Days of Exercise per Week: 0 days    Minutes of Exercise per Session: 0 min  Stress: Stress Concern Present (10/19/2021)   [place]    Feeling of Stress : Rather much  Social Connections: Unknown (10/20/2021)   Social  Connection and Isolation Panel [NHANES]    Frequency of Communication with Friends and Family: More than three times a week    Frequency of Social Gatherings with Friends and Family: More than three times a week    Attends Religious Services: More than 4 times per year    Active Member of [employer] or Organizations: No    Attends Archivist Meetings: Never    Marital Status: Not on file  Intimate Partner Violence: Not At Risk (10/19/2021)   Humiliation, Afraid, Rape, and Kick questionnaire    Fear of Current or Ex-Partner: No    Emotionally Abused: No    Physically Abused: No    Sexually Abused: No    Review of Systems   General: Negative for anorexia, unintentional weight loss, fever, chills, fatigue, weakness. Eyes: Negative for vision changes.  ENT: Negative for hoarseness, difficulty swallowing , nasal congestion. CV: Negative for chest pain, angina, palpitations, dyspnea on exertion, peripheral edema.  Respiratory: Negative for dyspnea at rest, dyspnea on exertion, cough, sputum, wheezing.  GI: See history of present illness. GU:  Negative for dysuria, hematuria, urinary incontinence, urinary frequency, nocturnal urination.  MS: Negative for joint pain, low back pain.  Derm: Negative for rash or itching.  Neuro: Negative for weakness, abnormal sensation, seizure, frequent headaches, memory loss,  confusion.  Psych: Negative for anxiety, depression, suicidal ideation, hallucinations.  Endo: Negative for unusual weight change.  Heme: Negative for bruising or bleeding. Allergy: Negative for rash or hives.  Physical Exam   BP 123/86 (BP Location: Right Arm, Patient Position: Sitting, Cuff Size: Large)   Pulse 86   Temp 98 F (36.7 C) (Oral)   Ht '5\' 4"'$  (1.626 m)   Wt 214 lb 9.6 oz (97.3 kg)   LMP 03/06/2013   SpO2 95%   BMI 36.84 kg/m    General: Well-nourished, well-developed in no acute distress.  Head: Normocephalic, atraumatic.   Eyes: Conjunctiva pink, no  icterus. Mouth: Oropharyngeal mucosa moist and pink   Neck: Supple without thyromegaly, masses, or lymphadenopathy.  Lungs: Clear to auscultation bilaterally.  Heart: Regular rate and rhythm, no murmurs rubs or gallops.  Abdomen: Bowel sounds are normal, nontender, nondistended, no hepatosplenomegaly or masses,  no abdominal bruits or hernia, no rebound or guarding.   Rectal: not performed Extremities: No lower extremity edema. No clubbing or deformities.  Neuro: Alert and oriented x 4 , grossly normal neurologically.  Skin: Warm and dry, no rash or jaundice.   Psych: Alert and cooperative, normal mood and affect.  Labs  Lab Results  Component Value Date   CREATININE 0.89 07/11/2021   BUN 13 07/11/2021   NA 145 (H) 07/11/2021   K 4.3 07/11/2021   CL 107 (H) 07/11/2021   CO2 24 07/11/2021   Lab Results  Component Value Date   WBC 5.9 07/11/2021   HGB 12.8 07/11/2021   HCT 37.6 07/11/2021   MCV 90 07/11/2021   PLT 374 07/11/2021   Lab Results  Component Value Date   ALT 21 07/11/2021   AST 13 07/11/2021   ALKPHOS 72 07/11/2021   BILITOT 0.4 07/11/2021   Lab Results  Component Value Date   HGBA1C 5.5 07/11/2021    Imaging Studies   No results found.  Assessment   Positive Cologuard: No family history of colon cancer or colon polyps.  No lower GI symptoms.  Recommend colonoscopy for further evaluation.   PLAN   Colonoscopy in the near future.  ASA 2.  I have discussed the risks, alternatives, benefits with regards to but not limited to the risk of reaction to medication, bleeding, infection, perforation and the patient is agreeable to proceed. Written consent to be obtained. Patient will hold phentermine 7 days prior to colonoscopy.   Laureen Ochs. Bobby Rumpf, [place], [facility]

## 2022-02-03 NOTE — H&P (View-Only) (Signed)
GI Office Note    Referring Provider: Johnette Abraham, MD Primary Care Physician:  Johnette Abraham, MD  Primary Gastroenterologist: Elon Alas. Abbey Chatters, DO   Chief Complaint   Chief Complaint  Patient presents with   Colonoscopy    Positive cologuard     History of Present Illness   Cristina Harris is a 56 y.o. female presenting today at the request of Dr. Doren Custard for further evaluation of positive Cologuard.  Patient denies issues with chronic constipation, diarrhea, melena, rectal bleeding.  No abdominal pain or upper GI symptoms.  No family history of colon cancer or colon polyps to her knowledge.  Intentional weight loss on phentermine, 2 pounds in the past 2 months.  Recently had her teeth pulled, dentures fitted but requiring adjustments.  Completing amoxicillin.    Medications   Current Outpatient Medications  Medication Sig Dispense Refill   acetaminophen (TYLENOL) 500 MG tablet Take by mouth.     albuterol (VENTOLIN HFA) 108 (90 Base) MCG/ACT inhaler Inhale 1 puff into the lungs as needed for wheezing or shortness of breath. 8.5 each 1   amLODipine (NORVASC) 5 MG tablet Take 1 tablet (5 mg total) by mouth daily. 90 tablet 1   amoxicillin (AMOXIL) 875 MG tablet SMARTSIG:1 Tablet(s) By Mouth Every 12 Hours     ibuprofen (ADVIL) 800 MG tablet Take 800 mg by mouth.     phentermine 15 MG capsule Take 1 capsule (15 mg total) by mouth every morning. 30 capsule 0   No current facility-administered medications for this visit.    Allergies   Allergies as of 02/03/2022   (No Known Allergies)    Past Medical History   Past Medical History:  Diagnosis Date   Carpal tunnel syndrome on both sides     Past Surgical History   Past Surgical History:  Procedure Laterality Date   CARPAL TUNNEL RELEASE Right 2022   NASAL FRACTURE SURGERY     TUBAL LIGATION     WRIST ARTHROSCOPY Right 2022    Past Family History   Family History  Problem Relation Age of Onset    Breast cancer Mother    Heart failure Father    Diabetes type II Father    Heart failure Brother    Lupus Daughter    Diabetes type II Maternal Grandmother    Colon cancer Neg Hx    Cervical cancer Neg Hx     Past Social History   Social History   Socioeconomic History   Marital status: Single    Spouse name: Not on file   Number of children: 5   Years of education: 12   Highest education level: 12th grade  Occupational History   Not on file  Tobacco Use   Smoking status: Former    Types: Cigarettes    Quit date: 04/2021    Years since quitting: 0.8    Passive exposure: Past   Smokeless tobacco: Never   Tobacco comments:    Started smoking at age 40, quit smoking 2023.   Vaping Use   Vaping Use: Former  Substance and Sexual Activity   Alcohol use: Yes    Comment: occ   Drug use: No   Sexual activity: Not Currently    Partners: Male    Birth control/protection: Surgical  Other Topics Concern   Not on file  Social History Narrative   Lives home alone    Was a cna Likes hands on  Social Determinants of Health   Financial Resource Strain: Medium Risk (10/19/2021)   Overall Financial Resource Strain (CARDIA)    Difficulty of Paying Living Expenses: Somewhat hard  Food Insecurity: No Food Insecurity (11/07/2021)   Hunger Vital Sign    Worried About Running Out of Food in the Last Year: Never true    Ran Out of Food in the Last Year: Never true  Transportation Needs: No Transportation Needs (11/07/2021)   PRAPARE - Hydrologist (Medical): No    Lack of Transportation (Non-Medical): No  Physical Activity: Inactive (10/19/2021)   Exercise Vital Sign    Days of Exercise per Week: 0 days    Minutes of Exercise per Session: 0 min  Stress: Stress Concern Present (10/19/2021)   [place]    Feeling of Stress : Rather much  Social Connections: Unknown (10/20/2021)   Social  Connection and Isolation Panel [NHANES]    Frequency of Communication with Friends and Family: More than three times a week    Frequency of Social Gatherings with Friends and Family: More than three times a week    Attends Religious Services: More than 4 times per year    Active Member of [employer] or Organizations: No    Attends Archivist Meetings: Never    Marital Status: Not on file  Intimate Partner Violence: Not At Risk (10/19/2021)   Humiliation, Afraid, Rape, and Kick questionnaire    Fear of Current or Ex-Partner: No    Emotionally Abused: No    Physically Abused: No    Sexually Abused: No    Review of Systems   General: Negative for anorexia, unintentional weight loss, fever, chills, fatigue, weakness. Eyes: Negative for vision changes.  ENT: Negative for hoarseness, difficulty swallowing , nasal congestion. CV: Negative for chest pain, angina, palpitations, dyspnea on exertion, peripheral edema.  Respiratory: Negative for dyspnea at rest, dyspnea on exertion, cough, sputum, wheezing.  GI: See history of present illness. GU:  Negative for dysuria, hematuria, urinary incontinence, urinary frequency, nocturnal urination.  MS: Negative for joint pain, low back pain.  Derm: Negative for rash or itching.  Neuro: Negative for weakness, abnormal sensation, seizure, frequent headaches, memory loss,  confusion.  Psych: Negative for anxiety, depression, suicidal ideation, hallucinations.  Endo: Negative for unusual weight change.  Heme: Negative for bruising or bleeding. Allergy: Negative for rash or hives.  Physical Exam   BP 123/86 (BP Location: Right Arm, Patient Position: Sitting, Cuff Size: Large)   Pulse 86   Temp 98 F (36.7 C) (Oral)   Ht '5\' 4"'$  (1.626 m)   Wt 214 lb 9.6 oz (97.3 kg)   LMP 03/06/2013   SpO2 95%   BMI 36.84 kg/m    General: Well-nourished, well-developed in no acute distress.  Head: Normocephalic, atraumatic.   Eyes: Conjunctiva pink, no  icterus. Mouth: Oropharyngeal mucosa moist and pink   Neck: Supple without thyromegaly, masses, or lymphadenopathy.  Lungs: Clear to auscultation bilaterally.  Heart: Regular rate and rhythm, no murmurs rubs or gallops.  Abdomen: Bowel sounds are normal, nontender, nondistended, no hepatosplenomegaly or masses,  no abdominal bruits or hernia, no rebound or guarding.   Rectal: not performed Extremities: No lower extremity edema. No clubbing or deformities.  Neuro: Alert and oriented x 4 , grossly normal neurologically.  Skin: Warm and dry, no rash or jaundice.   Psych: Alert and cooperative, normal mood and affect.  Labs  Lab Results  Component Value Date   CREATININE 0.89 07/11/2021   BUN 13 07/11/2021   NA 145 (H) 07/11/2021   K 4.3 07/11/2021   CL 107 (H) 07/11/2021   CO2 24 07/11/2021   Lab Results  Component Value Date   WBC 5.9 07/11/2021   HGB 12.8 07/11/2021   HCT 37.6 07/11/2021   MCV 90 07/11/2021   PLT 374 07/11/2021   Lab Results  Component Value Date   ALT 21 07/11/2021   AST 13 07/11/2021   ALKPHOS 72 07/11/2021   BILITOT 0.4 07/11/2021   Lab Results  Component Value Date   HGBA1C 5.5 07/11/2021    Imaging Studies   No results found.  Assessment   Positive Cologuard: No family history of colon cancer or colon polyps.  No lower GI symptoms.  Recommend colonoscopy for further evaluation.   PLAN   Colonoscopy in the near future.  ASA 2.  I have discussed the risks, alternatives, benefits with regards to but not limited to the risk of reaction to medication, bleeding, infection, perforation and the patient is agreeable to proceed. Written consent to be obtained. Patient will hold phentermine 7 days prior to colonoscopy.   Laureen Ochs. Bobby Rumpf, [place], [facility]

## 2022-02-03 NOTE — Telephone Encounter (Signed)
Availty PA: NO ACTION REQUIRED

## 2022-02-03 NOTE — Patient Instructions (Addendum)
Colonoscopy to be scheduled. See separate instructions.  You will need to hold phentermine 7 days before your colonoscopy, please see separate instructions.

## 2022-02-10 ENCOUNTER — Ambulatory Visit: Payer: 59 | Admitting: Internal Medicine

## 2022-02-14 ENCOUNTER — Ambulatory Visit (HOSPITAL_BASED_OUTPATIENT_CLINIC_OR_DEPARTMENT_OTHER): Payer: Medicaid Other | Admitting: Anesthesiology

## 2022-02-14 ENCOUNTER — Ambulatory Visit (HOSPITAL_COMMUNITY): Payer: Medicaid Other | Admitting: Anesthesiology

## 2022-02-14 ENCOUNTER — Ambulatory Visit (HOSPITAL_COMMUNITY)
Admission: RE | Admit: 2022-02-14 | Discharge: 2022-02-14 | Disposition: A | Discharge status: Home / Self Care | Payer: Medicaid Other | Attending: Internal Medicine | Admitting: Internal Medicine

## 2022-02-14 ENCOUNTER — Other Ambulatory Visit: Payer: Self-pay

## 2022-02-14 ENCOUNTER — Encounter (HOSPITAL_COMMUNITY)
Admission: RE | Disposition: A | Discharge status: Home / Self Care | Payer: Self-pay | Source: Home / Self Care | Attending: Internal Medicine

## 2022-02-14 ENCOUNTER — Encounter (HOSPITAL_COMMUNITY): Payer: Self-pay

## 2022-02-14 DIAGNOSIS — R195 Other fecal abnormalities: Secondary | ICD-10-CM | POA: Diagnosis not present

## 2022-02-14 DIAGNOSIS — I10 Essential (primary) hypertension: Secondary | ICD-10-CM | POA: Diagnosis not present

## 2022-02-14 DIAGNOSIS — Z87891 Personal history of nicotine dependence: Secondary | ICD-10-CM | POA: Diagnosis not present

## 2022-02-14 DIAGNOSIS — J45909 Unspecified asthma, uncomplicated: Secondary | ICD-10-CM | POA: Diagnosis not present

## 2022-02-14 DIAGNOSIS — D122 Benign neoplasm of ascending colon: Secondary | ICD-10-CM | POA: Diagnosis not present

## 2022-02-14 DIAGNOSIS — K635 Polyp of colon: Secondary | ICD-10-CM | POA: Diagnosis not present

## 2022-02-14 DIAGNOSIS — K648 Other hemorrhoids: Secondary | ICD-10-CM | POA: Diagnosis not present

## 2022-02-14 DIAGNOSIS — K649 Unspecified hemorrhoids: Secondary | ICD-10-CM

## 2022-02-14 DIAGNOSIS — Z1211 Encounter for screening for malignant neoplasm of colon: Secondary | ICD-10-CM | POA: Diagnosis not present

## 2022-02-14 HISTORY — PX: POLYPECTOMY: SHX5525

## 2022-02-14 HISTORY — PX: COLONOSCOPY WITH PROPOFOL: SHX5780

## 2022-02-14 SURGERY — COLONOSCOPY WITH PROPOFOL
Anesthesia: General

## 2022-02-14 MED ORDER — LIDOCAINE HCL (CARDIAC) PF 100 MG/5ML IV SOSY
PREFILLED_SYRINGE | INTRAVENOUS | Status: DC | PRN
  Administered 2022-02-14: 60 mg via INTRAVENOUS

## 2022-02-14 MED ORDER — LACTATED RINGERS IV SOLN: INTRAVENOUS | Status: DC

## 2022-02-14 MED ORDER — PROPOFOL 10 MG/ML IV BOLUS
INTRAVENOUS | Status: DC | PRN
  Administered 2022-02-14: 100 mg via INTRAVENOUS
  Administered 2022-02-14 (×4): 50 mg via INTRAVENOUS

## 2022-02-14 NOTE — Transfer of Care (Signed)
Immediate Anesthesia Transfer of Care Note  Patient: Cristina Harris  Procedure(s) Performed: COLONOSCOPY WITH PROPOFOL POLYPECTOMY  Patient Location: Endoscopy Unit  Anesthesia Type:General  Level of Consciousness: drowsy  Airway & Oxygen Therapy: Patient Spontanous Breathing  Post-op Assessment: Report given to RN and Post -op Vital signs reviewed and stable  Post vital signs: Reviewed and stable  Last Vitals:  Vitals Value Taken Time  BP 98/64 02/14/22 0848  Temp    Pulse 80 02/14/22 0848  Resp 15 02/14/22 0848  SpO2 99 % 02/14/22 0848  Vitals shown include unvalidated device data.  Last Pain:  Vitals:   02/14/22 0826  TempSrc:   PainSc: 0-No pain      Patients Stated Pain Goal: 7 (81/38/87 1959)  Complications: No notable events documented.

## 2022-02-14 NOTE — Op Note (Signed)
[facility] Patient Name: Cristina Harris Procedure Date: 02/14/2022 8:17 AM MRN: [id number] Date of Birth: 1965/02/12 Attending MD: Elon Alas. Abbey Chatters , [place], [id number] CSN: [id number] Age: 57 Admit Type: Outpatient Procedure:                Colonoscopy Indications:              Positive Cologuard test Providers:                Elon Alas. Abbey Chatters, DO, Caprice Kluver, Aram Candela Referring MD:             Elon Alas. Abbey Chatters, DO Medicines:                See the Anesthesia note for documentation of the                            administered medications Complications:            No immediate complications. Estimated Blood Loss:     Estimated blood loss was minimal. Procedure:                Pre-Anesthesia Assessment:                           - The anesthesia plan was to use monitored                            anesthesia care (MAC).                           After obtaining informed consent, the colonoscope                            was passed under direct vision. Throughout the                            procedure, the patient's blood pressure, pulse, and                            oxygen saturations were monitored continuously. The                            [id number]-HQ190L ([id number]) scope was introduced through                            the anus and advanced to the the cecum, identified                            by appendiceal orifice and ileocecal valve. The                            colonoscopy was performed without difficulty. The                            patient tolerated the procedure well. The quality  of the bowel preparation was evaluated using the                            BBPS [facility] Bowel Preparation Scale) with scores                            of: Right Colon = 2 (minor amount of residual                            staining, small fragments of stool and/or opaque                            liquid, but mucosa seen well), Transverse Colon = 3                             (entire mucosa seen well with no residual staining,                            small fragments of stool or opaque liquid) and Left                            Colon = 2 (minor amount of residual staining, small                            fragments of stool and/or opaque liquid, but mucosa                            seen well). The total BBPS score equals 7. The                            quality of the bowel preparation was good. Scope In: 8:31:07 AM Scope Out: 8:45:27 AM Scope Withdrawal Time: 0 hours 11 minutes 57 seconds  Total Procedure Duration: 0 hours 14 minutes 20 seconds  Findings:      The perianal and digital rectal examinations were normal.      Non-bleeding internal hemorrhoids were found during endoscopy.      A 2 mm polyp was found in the ascending colon. The polyp was sessile.       The polyp was removed with a cold biopsy forceps. Resection and       retrieval were complete.      The entire examined colon appeared normal on direct and retroflexion       views. Impression:               - Non-bleeding internal hemorrhoids.                           - One 2 mm polyp in the ascending colon, removed                            with a cold biopsy forceps. Resected and retrieved.                           - The  entire examined colon is normal on direct and                            retroflexion views. Moderate Sedation:      Per Anesthesia Care Recommendation:           - Patient has a contact number available for                            emergencies. The signs and symptoms of potential                            delayed complications were discussed with the                            patient. Return to normal activities tomorrow.                            Written discharge instructions were provided to the                            patient.                           - Resume previous diet.                           - Continue present  medications.                           - Await pathology results.                           - Repeat colonoscopy in 7-10 years for surveillance.                           - Return to GI clinic PRN. Procedure Code(s):        --- Professional ---                           [phone], Colonoscopy, flexible; with biopsy, single                            or multiple Diagnosis Code(s):        --- Professional ---                           D12.2, Benign neoplasm of ascending colon                           K64.8, Other hemorrhoids                           R19.5, Other fecal abnormalities CPT copyright 2022 American Medical Association. All rights reserved. The codes documented in this report are preliminary and upon coder review may  be revised to meet current compliance requirements. Elon Alas. Abbey Chatters, DO Cameron Ecorse, DO 02/14/2022  8:54:43 AM This report has been signed electronically. Number of Addenda: 0

## 2022-02-14 NOTE — Interval H&P Note (Signed)
History and Physical Interval Note:  02/14/2022 8:02 AM  Cristina Harris  has presented today for surgery, with the diagnosis of positive cologuard.  The various methods of treatment have been discussed with the patient and family. After consideration of risks, benefits and other options for treatment, the patient has consented to  Procedure(s) with comments: COLONOSCOPY WITH PROPOFOL (N/A) - 1:30 pm, pt knows to arrive at 7:00 as a surgical intervention.  The patient's history has been reviewed, patient examined, no change in status, stable for surgery.  I have reviewed the patient's chart and labs.  Questions were answered to the patient's satisfaction.     Eloise Harman

## 2022-02-14 NOTE — Discharge Instructions (Addendum)
  Colonoscopy Discharge Instructions  Read the instructions outlined below and refer to this sheet in the next few weeks. These discharge instructions provide you with general information on caring for yourself after you leave the hospital. Your doctor may also give you specific instructions. While your treatment has been planned according to the most current medical practices available, unavoidable complications occasionally occur.   ACTIVITY You may resume your regular activity, but move at a slower pace for the next 24 hours.  Take frequent rest periods for the next 24 hours.  Walking will help get rid of the air and reduce the bloated feeling in your belly (abdomen).  No driving for 24 hours (because of the medicine (anesthesia) used during the test).   Do not sign any important legal documents or operate any machinery for 24 hours (because of the anesthesia used during the test).  NUTRITION Drink plenty of fluids.  You may resume your normal diet as instructed by your doctor.  Begin with a light meal and progress to your normal diet. Heavy or fried foods are harder to digest and may make you feel sick to your stomach (nauseated).  Avoid alcoholic beverages for 24 hours or as instructed.  MEDICATIONS You may resume your normal medications unless your doctor tells you otherwise.  WHAT YOU CAN EXPECT TODAY Some feelings of bloating in the abdomen.  Passage of more gas than usual.  Spotting of blood in your stool or on the toilet paper.  IF YOU HAD POLYPS REMOVED DURING THE COLONOSCOPY: No aspirin products for 7 days or as instructed.  No alcohol for 7 days or as instructed.  Eat a soft diet for the next 24 hours.  FINDING OUT THE RESULTS OF YOUR TEST Not all test results are available during your visit. If your test results are not back during the visit, make an appointment with your caregiver to find out the results. Do not assume everything is normal if you have not heard from your  caregiver or the medical facility. It is important for you to follow up on all of your test results.  SEEK IMMEDIATE MEDICAL ATTENTION IF: You have more than a spotting of blood in your stool.  Your belly is swollen (abdominal distention).  You are nauseated or vomiting.  You have a temperature over 101.  You have abdominal pain or discomfort that is severe or gets worse throughout the day.   Your colonoscopy revealed 1 small polyp(s) which I removed successfully. Await pathology results, my office will contact you. I recommend repeating colonoscopy in 7-10 years for surveillance purposes depending on pathology results.   Otherwise follow up with GI as needed  I hope you have a great rest of your week!  Elon Alas. Abbey Chatters, D.O. Gastroenterology and Hepatology [facility]

## 2022-02-14 NOTE — Anesthesia Preprocedure Evaluation (Signed)
Anesthesia Evaluation  Patient identified by MRN, date of birth, ID band Patient awake    Reviewed: Allergy & Precautions, H&P , NPO status , Patient's Chart, lab work & pertinent test results, reviewed documented beta blocker date and time   Airway Mallampati: II  TM Distance: >3 FB Neck ROM: full    Dental no notable dental hx.    Pulmonary neg pulmonary ROS, asthma , Patient abstained from smoking., former smoker   Pulmonary exam normal breath sounds clear to auscultation       Cardiovascular Exercise Tolerance: Good hypertension, negative cardio ROS  Rhythm:regular Rate:Normal     Neuro/Psych  PSYCHIATRIC DISORDERS  Depression     Neuromuscular disease negative neurological ROS  negative psych ROS   GI/Hepatic negative GI ROS, Neg liver ROS,,,  Endo/Other  negative endocrine ROS    Renal/GU negative Renal ROS  negative genitourinary   Musculoskeletal   Abdominal   Peds  Hematology negative hematology ROS (+)   Anesthesia Other Findings   Reproductive/Obstetrics negative OB ROS                             Anesthesia Physical Anesthesia Plan  ASA: 2  Anesthesia Plan: General   Post-op Pain Management:    Induction:   PONV Risk Score and Plan: Propofol infusion  Airway Management Planned:   Additional Equipment:   Intra-op Plan:   Post-operative Plan:   Informed Consent: I have reviewed the patients History and Physical, chart, labs and discussed the procedure including the risks, benefits and alternatives for the proposed anesthesia with the patient or authorized representative who has indicated his/her understanding and acceptance.     Dental Advisory Given  Plan Discussed with: CRNA  Anesthesia Plan Comments:        Anesthesia Quick Evaluation

## 2022-02-15 ENCOUNTER — Other Ambulatory Visit: Payer: Self-pay | Admitting: Internal Medicine

## 2022-02-15 DIAGNOSIS — J45909 Unspecified asthma, uncomplicated: Secondary | ICD-10-CM

## 2022-02-16 LAB — SURGICAL PATHOLOGY

## 2022-02-17 ENCOUNTER — Encounter: Payer: Self-pay | Admitting: Internal Medicine

## 2022-02-17 ENCOUNTER — Ambulatory Visit (INDEPENDENT_AMBULATORY_CARE_PROVIDER_SITE_OTHER): Payer: Medicaid Other | Admitting: Internal Medicine

## 2022-02-17 ENCOUNTER — Telehealth: Payer: Self-pay | Admitting: Internal Medicine

## 2022-02-17 DIAGNOSIS — E669 Obesity, unspecified: Secondary | ICD-10-CM | POA: Diagnosis not present

## 2022-02-17 MED ORDER — SEMAGLUTIDE-WEIGHT MANAGEMENT 1.7 MG/0.75ML ~~LOC~~ SOAJ: 1.7000 mg | SUBCUTANEOUS | 0 refills | Status: DC

## 2022-02-17 MED ORDER — SEMAGLUTIDE-WEIGHT MANAGEMENT 1 MG/0.5ML ~~LOC~~ SOAJ: 1.0000 mg | SUBCUTANEOUS | 0 refills | Status: DC

## 2022-02-17 MED ORDER — SEMAGLUTIDE-WEIGHT MANAGEMENT 0.5 MG/0.5ML ~~LOC~~ SOAJ: 0.5000 mg | SUBCUTANEOUS | 0 refills | Status: DC

## 2022-02-17 MED ORDER — SEMAGLUTIDE-WEIGHT MANAGEMENT 2.4 MG/0.75ML ~~LOC~~ SOAJ: 2.4000 mg | SUBCUTANEOUS | 0 refills | Status: DC

## 2022-02-17 MED ORDER — SEMAGLUTIDE-WEIGHT MANAGEMENT 0.25 MG/0.5ML ~~LOC~~ SOAJ: 0.2500 mg | SUBCUTANEOUS | 0 refills | Status: AC

## 2022-02-17 MED ORDER — PHENTERMINE HCL 15 MG PO CAPS: 15.0000 mg | ORAL_CAPSULE | ORAL | 0 refills | Status: DC

## 2022-02-17 NOTE — Progress Notes (Signed)
Established Patient Office Visit  Subjective   Patient ID: Cristina Harris, female    DOB: [birth year]  Age: 57 y.o. MRN: [id number]  Chief Complaint  Patient presents with   Weight Loss    Follow up med refill   Cristina Harris returns to care today.  She was last seen by me on 12/5 for weight loss follow-up.  She was started on phentermine 15 mg daily in November.  In the interim she has been seen by GI for a positive Cologuard and underwent colonoscopy earlier this week (1/9).  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  Her colonoscopy was otherwise unremarkable.  Repeat colonoscopy is recommended for 7-10 years.  Today Cristina Harris reports feeling well.  She has lost 5 pounds since her last appointment.  She continues to walk daily and avoiding sweets and soft drinks.  Past Medical History:  Diagnosis Date   Carpal tunnel syndrome on both sides    Past Surgical History:  Procedure Laterality Date   CARPAL TUNNEL RELEASE Right 2022   NASAL FRACTURE SURGERY     TUBAL LIGATION     WRIST ARTHROSCOPY Right 2022   Social History   Tobacco Use   Smoking status: Former    Types: Cigarettes    Quit date: 04/2021    Years since quitting: 0.8    Passive exposure: Past   Smokeless tobacco: Never   Tobacco comments:    Started smoking at age 1, quit smoking 2023.   Vaping Use   Vaping Use: Former  Substance Use Topics   Alcohol use: Yes    Comment: occ   Drug use: No   Family History  Problem Relation Age of Onset   Breast cancer Mother    Heart failure Father    Diabetes type II Father    Heart failure Brother    Lupus Daughter    Diabetes type II Maternal Grandmother    Colon cancer Neg Hx    Cervical cancer Neg Hx    No Known Allergies  Review of Systems  Constitutional:  Negative for chills and fever.  HENT:  Negative for sore throat.   Respiratory:  Negative for cough and shortness of breath.   Cardiovascular:  Negative for chest pain, palpitations  and leg swelling.  Gastrointestinal:  Negative for abdominal pain, blood in stool, constipation, diarrhea, nausea and vomiting.  Genitourinary:  Negative for dysuria and hematuria.  Musculoskeletal:  Negative for myalgias.  Skin:  Negative for itching and rash.  Neurological:  Negative for dizziness and headaches.  Psychiatric/Behavioral:  Negative for depression and suicidal ideas.      Objective:     BP 137/89   Pulse 93   Ht '5\' 4"'$  (1.626 m)   Wt 214 lb 3.2 oz (97.2 kg)   LMP 03/06/2013   SpO2 94%   BMI 36.77 kg/m  BP Readings from Last 3 Encounters:  02/17/22 137/89  02/14/22 98/64  02/03/22 123/86   Physical Exam Vitals reviewed.  Constitutional:      General: She is not in acute distress.    Appearance: Normal appearance. She is obese. She is not toxic-appearing.  HENT:     Head: Normocephalic and atraumatic.     Nose: Nose normal. No congestion or rhinorrhea.     Mouth/Throat:     Mouth: Mucous membranes are moist.     Pharynx: Oropharynx is clear. No oropharyngeal exudate or posterior oropharyngeal erythema.  Eyes:     General:  No scleral icterus.    Extraocular Movements: Extraocular movements intact.     Conjunctiva/sclera: Conjunctivae normal.     Pupils: Pupils are equal, round, and reactive to light.  Cardiovascular:     Rate and Rhythm: Normal rate and regular rhythm.     Pulses: Normal pulses.     Heart sounds: Normal heart sounds. No murmur heard.    No friction rub. No gallop.  Pulmonary:     Effort: Pulmonary effort is normal. No respiratory distress.     Breath sounds: Normal breath sounds. No wheezing, rhonchi or rales.  Abdominal:     General: Abdomen is flat. Bowel sounds are normal. There is no distension.     Palpations: Abdomen is soft.     Tenderness: There is no abdominal tenderness.  Musculoskeletal:        General: No swelling or deformity.     Cervical back: Normal range of motion.     Right lower leg: No edema.     Left lower leg:  No edema.  Skin:    General: Skin is warm and dry.     Capillary Refill: Capillary refill takes less than 2 seconds.     Coloration: Skin is not jaundiced.  Neurological:     General: No focal deficit present.     Mental Status: She is alert and oriented to person, place, and time.     Motor: No weakness.     Gait: Gait normal.  Psychiatric:        Mood and Affect: Mood normal.        Behavior: Behavior normal.        Thought Content: Thought content normal.        Judgment: Judgment normal.   Last CBC Lab Results  Component Value Date   WBC 5.9 07/11/2021   HGB 12.8 07/11/2021   HCT 37.6 07/11/2021   MCV 90 07/11/2021   MCH 30.7 07/11/2021   RDW 13.0 07/11/2021   PLT 374 98/92/1194   Last metabolic panel Lab Results  Component Value Date   GLUCOSE 86 07/11/2021   NA 145 (H) 07/11/2021   K 4.3 07/11/2021   CL 107 (H) 07/11/2021   CO2 24 07/11/2021   BUN 13 07/11/2021   CREATININE 0.89 07/11/2021   EGFR 76 07/11/2021   CALCIUM 9.3 07/11/2021   PROT 7.6 07/11/2021   ALBUMIN 4.1 07/11/2021   LABGLOB 3.5 07/11/2021   AGRATIO 1.2 07/11/2021   BILITOT 0.4 07/11/2021   ALKPHOS 72 07/11/2021   AST 13 07/11/2021   ALT 21 07/11/2021   ANIONGAP 4 (L) 07/06/2016   Last lipids Lab Results  Component Value Date   CHOL 220 (H) 07/11/2021   HDL 60 07/11/2021   LDLCALC 148 (H) 07/11/2021   TRIG 71 07/11/2021   CHOLHDL 3.7 07/11/2021   Last hemoglobin A1c Lab Results  Component Value Date   HGBA1C 5.5 07/11/2021   Last thyroid functions Lab Results  Component Value Date   TSH 1.000 07/11/2021   Last vitamin D Lab Results  Component Value Date   VD25OH 20.9 (L) 10/17/2021   The 10-year ASCVD risk score (Arnett DK, et al., 2019) is: 7.6%    Assessment & Plan:   Problem List Items Addressed This Visit       Obesity (BMI 30-39.9)    Presenting today for weight loss follow-up.  She has lost 5 pounds since her last appointment.  PDMP reviewed and is  appropriate.  Her insurance  has changed and she now has Medicaid.  She is interested in starting [facility] if this is an option. -Phentermine 15 mg daily has been refilled today -I will also send in a prescription for [facility].  If it is found that this will be covered, Cristina Harris is in agreement with discontinuing phentermine and starting Wegovy. -Follow-up in 4 weeks      Return in about 4 weeks (around 03/17/2022) for Weight Loss.    Johnette Abraham, MD

## 2022-02-17 NOTE — Telephone Encounter (Signed)
Pt wants a call back in regard to appt this morning

## 2022-02-17 NOTE — Telephone Encounter (Signed)
Returned patient call about wegovy.

## 2022-02-17 NOTE — Patient Instructions (Signed)
It was a pleasure to see you today.  Thank you for giving [place] the opportunity to be involved in your care.  Below is a brief recap of your visit and next steps.  We will plan to see you again in 4 weeks.  Summary Phentermine refilled today We will work on getting Wegovy. Follow up in 4 weeks.

## 2022-02-17 NOTE — Assessment & Plan Note (Signed)
Presenting today for weight loss follow-up.  She has lost 5 pounds since her last appointment.  PDMP reviewed and is appropriate.  Her insurance has changed and she now has Medicaid.  She is interested in starting [facility] if this is an option. -Phentermine 15 mg daily has been refilled today -I will also send in a prescription for [facility].  If it is found that this will be covered, Cristina Harris is in agreement with discontinuing phentermine and starting [facility]. -Follow-up in 4 weeks

## 2022-02-18 NOTE — Anesthesia Postprocedure Evaluation (Signed)
Anesthesia Post Note  Patient: Cristina Harris  Procedure(s) Performed: COLONOSCOPY WITH PROPOFOL POLYPECTOMY  Patient location during evaluation: Phase II Anesthesia Type: General Level of consciousness: awake Pain management: pain level controlled Vital Signs Assessment: post-procedure vital signs reviewed and stable Respiratory status: spontaneous breathing and respiratory function stable Cardiovascular status: blood pressure returned to baseline and stable Postop Assessment: no headache and no apparent nausea or vomiting Anesthetic complications: no Comments: Late entry   No notable events documented.   Last Vitals:  Vitals:   02/14/22 0847 02/14/22 0850  BP: 98/64   Pulse: 80 77  Resp: 15 15  Temp: 36.6 C   SpO2: 99% 99%    Last Pain:  Vitals:   02/14/22 0850  TempSrc:   PainSc: 0-No pain                 [name]

## 2022-02-20 ENCOUNTER — Encounter (HOSPITAL_COMMUNITY): Payer: Self-pay | Admitting: Internal Medicine

## 2022-02-21 ENCOUNTER — Telehealth: Payer: Self-pay | Admitting: Internal Medicine

## 2022-02-21 NOTE — Telephone Encounter (Signed)
Returned patient call.

## 2022-02-21 NOTE — Telephone Encounter (Signed)
Pt returning call to [facility]

## 2022-03-07 ENCOUNTER — Ambulatory Visit: Payer: Self-pay

## 2022-03-07 ENCOUNTER — Encounter: Payer: 59 | Admitting: *Deleted

## 2022-03-07 NOTE — Patient Outreach (Signed)
  Care Coordination   Follow Up Visit Note   03/07/2022 Name: Cristina Harris MRN: [id number] DOB: 05-Jun-1965  Cristina Harris is a 57 y.o. year old female who sees Doren Custard, Hazle Nordmann, MD for primary care. I spoke with  Cristina Harris by phone today.  What matters to the patients health and wellness today?  Patient unable to talk as she at an appointment with her granddaughter.  Patient agrees to reschedule.      Goals Addressed   None     SDOH assessments and interventions completed:  No     Care Coordination Interventions:  No, not indicated   Follow up plan: Follow up call scheduled for February    Encounter Outcome:  Pt. Visit Completed   Jone Baseman, RN, MSN [place] [facility] Care Management Coordinator Direct Line [phone]

## 2022-03-17 ENCOUNTER — Ambulatory Visit: Payer: Medicaid Other | Admitting: Internal Medicine

## 2022-03-17 ENCOUNTER — Encounter: Payer: Self-pay | Admitting: Internal Medicine

## 2022-03-17 VITALS — BP 133/86 | HR 93 | Ht 64.0 in | Wt 210.4 lb

## 2022-03-17 DIAGNOSIS — E669 Obesity, unspecified: Secondary | ICD-10-CM

## 2022-03-17 MED ORDER — PHENTERMINE HCL 15 MG PO CAPS: 15.0000 mg | ORAL_CAPSULE | ORAL | 0 refills | Status: DC

## 2022-03-17 NOTE — Patient Instructions (Signed)
It was a pleasure to see you today.  Thank you for giving [place] the opportunity to be involved in your care.  Below is a brief recap of your visit and next steps.  We will plan to see you again in 4 weeks.  Summary Phentermine refilled We will see if your insurance is going to cover Wegovy Follow up for pap smear in 1 month

## 2022-03-17 NOTE — Assessment & Plan Note (Signed)
Presenting today for weight loss follow-up.  She has lost an additional 4 pounds since her last appointment.  Overall she has lost 10 pounds since starting phentermine in November.  We are still attempting to transition her to [facility]. -Phentermine 15 mg daily has been refilled today.  Investigating Wegovy coverage.  I again reviewed with Ms. Scopel that all weight loss medications are intended for use in conjunction with lifestyle modifications and weight loss.  I reinforced the need to exercise regularly and to practice healthy eating habits.  She is aware that phentermine is a short-term medication will be continued after 6 months. -Follow-up in 1 month

## 2022-03-17 NOTE — Progress Notes (Signed)
Established Patient Office Visit  Subjective   Patient ID: Annastyn Ingles, female    DOB: Jun 27, 1965  Age: 57 y.o. MRN: [id number]  Chief Complaint  Patient presents with   Weight Loss    Follow up   Ms. Colletta returns to care today for weight loss follow-up.  She was last seen by me 1/12 at which time phentermine was refilled and a prescription for [facility] was also sent with the understanding that phentermine would be discontinued if it was found that [facility] would be covered.  There have been no acute interval events. Ms. Dietel reports feeling well today. She has lost 4 pounds since her last appointment and is joining the [facility] today.  She is asymptomatic and has no additional concerns to discuss.  Past Medical History:  Diagnosis Date   Carpal tunnel syndrome on both sides    Past Surgical History:  Procedure Laterality Date   CARPAL TUNNEL RELEASE Right 2022   COLONOSCOPY WITH PROPOFOL N/A 02/14/2022   Procedure: COLONOSCOPY WITH PROPOFOL;  Surgeon: Eloise Harman, DO;  [facility];  Service: Endoscopy;  Laterality: N/A;  1:30 pm, pt knows to arrive at 7:00   NASAL FRACTURE SURGERY     POLYPECTOMY  02/14/2022   Procedure: POLYPECTOMY;  Surgeon: Eloise Harman, DO;  Location: [facility];  Service: Endoscopy;;   TUBAL LIGATION     WRIST ARTHROSCOPY Right 2022   Social History   Tobacco Use   Smoking status: Former    Types: Cigarettes    Quit date: 04/2021    Years since quitting: 0.9    Passive exposure: Past   Smokeless tobacco: Never   Tobacco comments:    Started smoking at age 48, quit smoking 2023.   Vaping Use   Vaping Use: Former  Substance Use Topics   Alcohol use: Yes    Comment: occ   Drug use: No   Family History  Problem Relation Age of Onset   Breast cancer Mother    Heart failure Father    Diabetes type II Father    Heart failure Brother    Lupus Daughter    Diabetes type II Maternal Grandmother    Colon cancer Neg Hx     Cervical cancer Neg Hx    No Known Allergies  Review of Systems  Constitutional:  Negative for chills and fever.  HENT:  Negative for sore throat.   Respiratory:  Negative for cough and shortness of breath.   Cardiovascular:  Negative for chest pain, palpitations and leg swelling.  Gastrointestinal:  Negative for abdominal pain, blood in stool, constipation, diarrhea, nausea and vomiting.  Genitourinary:  Negative for dysuria and hematuria.  Musculoskeletal:  Negative for myalgias.  Skin:  Negative for itching and rash.  Neurological:  Negative for dizziness and headaches.  Psychiatric/Behavioral:  Negative for depression and suicidal ideas.      Objective:     BP 133/86   Pulse 93   Ht 5' 4"$  (1.626 m)   Wt 210 lb 6.4 oz (95.4 kg)   LMP 03/06/2013   SpO2 96%   BMI 36.12 kg/m  BP Readings from Last 3 Encounters:  03/17/22 133/86  02/17/22 137/89  02/14/22 98/64   Physical Exam Vitals reviewed.  Constitutional:      General: She is not in acute distress.    Appearance: Normal appearance. She is obese. She is not toxic-appearing.  HENT:     Head: Normocephalic and atraumatic.  Nose: Nose normal. No congestion or rhinorrhea.     Mouth/Throat:     Mouth: Mucous membranes are moist.     Pharynx: Oropharynx is clear. No oropharyngeal exudate or posterior oropharyngeal erythema.  Eyes:     General: No scleral icterus.    Extraocular Movements: Extraocular movements intact.     Conjunctiva/sclera: Conjunctivae normal.     Pupils: Pupils are equal, round, and reactive to light.  Cardiovascular:     Rate and Rhythm: Normal rate and regular rhythm.     Pulses: Normal pulses.     Heart sounds: Normal heart sounds. No murmur heard.    No friction rub. No gallop.  Pulmonary:     Effort: Pulmonary effort is normal. No respiratory distress.     Breath sounds: Normal breath sounds. No wheezing, rhonchi or rales.  Abdominal:     General: Abdomen is flat. Bowel sounds are  normal. There is no distension.     Palpations: Abdomen is soft.     Tenderness: There is no abdominal tenderness.  Musculoskeletal:        General: No swelling or deformity.     Cervical back: Normal range of motion.     Right lower leg: No edema.     Left lower leg: No edema.  Skin:    General: Skin is warm and dry.     Capillary Refill: Capillary refill takes less than 2 seconds.     Coloration: Skin is not jaundiced.  Neurological:     General: No focal deficit present.     Mental Status: She is alert and oriented to person, place, and time.     Motor: No weakness.     Gait: Gait normal.  Psychiatric:        Mood and Affect: Mood normal.        Behavior: Behavior normal.        Thought Content: Thought content normal.        Judgment: Judgment normal.   Last CBC Lab Results  Component Value Date   WBC 5.9 07/11/2021   HGB 12.8 07/11/2021   HCT 37.6 07/11/2021   MCV 90 07/11/2021   MCH 30.7 07/11/2021   RDW 13.0 07/11/2021   PLT 374 0000000   Last metabolic panel Lab Results  Component Value Date   GLUCOSE 86 07/11/2021   NA 145 (H) 07/11/2021   K 4.3 07/11/2021   CL 107 (H) 07/11/2021   CO2 24 07/11/2021   BUN 13 07/11/2021   CREATININE 0.89 07/11/2021   EGFR 76 07/11/2021   CALCIUM 9.3 07/11/2021   PROT 7.6 07/11/2021   ALBUMIN 4.1 07/11/2021   LABGLOB 3.5 07/11/2021   AGRATIO 1.2 07/11/2021   BILITOT 0.4 07/11/2021   ALKPHOS 72 07/11/2021   AST 13 07/11/2021   ALT 21 07/11/2021   ANIONGAP 4 (L) 07/06/2016   Last lipids Lab Results  Component Value Date   CHOL 220 (H) 07/11/2021   HDL 60 07/11/2021   LDLCALC 148 (H) 07/11/2021   TRIG 71 07/11/2021   CHOLHDL 3.7 07/11/2021   Last hemoglobin A1c Lab Results  Component Value Date   HGBA1C 5.5 07/11/2021   Last thyroid functions Lab Results  Component Value Date   TSH 1.000 07/11/2021   Last vitamin D Lab Results  Component Value Date   VD25OH 20.9 (L) 10/17/2021   The 10-year ASCVD  risk score (Arnett DK, et al., 2019) is: 6.9%    Assessment & Plan:  Problem List Items Addressed This Visit       Obesity (BMI 30-39.9) - Primary    Presenting today for weight loss follow-up.  She has lost an additional 4 pounds since her last appointment.  Overall she has lost 10 pounds since starting phentermine in November.  We are still attempting to transition her to [facility]. -Phentermine 15 mg daily has been refilled today.  Investigating Wegovy coverage.  I again reviewed with Ms. Benoy that all weight loss medications are intended for use in conjunction with lifestyle modifications and weight loss.  I reinforced the need to exercise regularly and to practice healthy eating habits.  She is aware that phentermine is a short-term medication will be continued after 6 months. -Follow-up in 1 month      Relevant Medications   phentermine 15 MG capsule   Return in about 1 month (around 04/15/2022) for weight loss, pap smear.    Johnette Abraham, MD

## 2022-03-21 ENCOUNTER — Ambulatory Visit: Payer: Self-pay | Admitting: *Deleted

## 2022-03-21 NOTE — Patient Instructions (Incomplete)
Visit Information  Thank you for taking time to visit with me today. Please don't hesitate to contact me if I can be of assistance to you.   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)   On track     Care Coordination Interventions: Discussed plans with patient for ongoing care management follow up and provided patient with direct contact information for care management team Encouragement provided,  RN CM assessed for initiation of new weight loss medication Confirmed with patient pcp is reviewing and to discuss again at next office visit      manage pain of both hands ([facility]) (pt-stated)   On track     Care Coordination Interventions: Assessed social determinant of health barriers Assessed pain  Assessed recent fall,  Active listening and encouragement provided      New dental provider [facility]) (pt-stated)        Care Coordination Interventions: Patient interviewed about adult health maintenance status including  Regular Dental Care    Advised patient to discuss  Regular Dental Care    with primary care provider  Assisted patient to find an [facility] dentist and scheduled an appointment.  Her appointment is on Wednesday April 11 2022 at 2:30 pm  Confirmed she does have an oral surgery on Jun 30 2022        Our next appointment is {NEXTVISITTYPE:26617} on *** at ***  Please call the care guide team at [phone] if you need to cancel or reschedule your appointment.   If you are experiencing a Mental Health or [place] or need someone to talk to, please {MHBHCRISISCONTACTS:26616}   {CM PT PRINT INSTRUCTIONS:22237}  {CM FOLLOW UP PLAN:22241}  SIGNATURE***

## 2022-03-21 NOTE — Patient Outreach (Signed)
  Care Coordination   Follow Up Visit Note   03/22/2022 Name: Cristina Harris MRN: [id number] DOB: [birth year]  Cristina Harris is a 57 y.o. year old female who sees Doren Custard, Hazle Nordmann, MD for primary care. I spoke with  Cristina Harris by phone today.  What matters to the patients health and wellness today?  Disability still pending She is wanting to go back to work    Weight loss 14 lbs  Now has full medicaid  Has all her teeth removed unable to find medicaid dentist locally  Agreed to see dentist Dr Joya Gaskins in [place] on Wednesday march 5 2:30 Pm  She reports she has a scheduled appoint to see her Oral surgery Jun 29 2021     Goals Addressed               This Visit's Progress     Patient Stated     Research officer, trade union for counseling, weight loss (THN) (pt-stated)   On track     Care Coordination Interventions: Discussed plans with patient for ongoing care management follow up and provided patient with direct contact information for care management team Encouragement provided,  RN CM assessed for weight loss   Confirmed weight loss of 14 lbs      manage pain of both hands (THN) (pt-stated)   On track     Care Coordination Interventions: Assessed social determinant of health barriers Assessed pain  Assessed recent fall,  Active listening and encouragement provided Confirmed disability still pending      New dental provider [facility]) (pt-stated)   On track     Care Coordination Interventions: Patient interviewed about adult health maintenance status including  Regular Dental Care    Advised patient to discuss  Regular Dental Care    with primary care provider  Assisted patient to find an [facility] dentist (Dr Joya Gaskins ) and scheduled an appointment.  Her appointment is on Wednesday April 11 2022 at 2:30 pm  Confirmed she does have an oral surgery office visit on Jun 30 2022        SDOH assessments and interventions completed:  No     Care Coordination  Interventions:  Yes, provided   Follow up plan: Follow up call scheduled for 03/24/22    Encounter Outcome:  Pt. Visit Completed   Cristina Vanwagoner L. Lavina Hamman, RN, BSN, [place] Coordinator Office number [phone]

## 2022-03-24 ENCOUNTER — Ambulatory Visit: Payer: Self-pay | Admitting: *Deleted

## 2022-03-24 NOTE — Patient Instructions (Signed)
Visit Information  Thank you for taking time to visit with me today. Please don't hesitate to contact me if I can be of assistance to you.   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)   On track     Care Coordination Interventions: Discussed plans with patient for ongoing care management follow up and provided patient with direct contact information for care management team Encouragement provided,  Interventions Today    Flowsheet Row Most Recent Value  Chronic Disease   Chronic disease during today's visit Other  [dental and disability/job]  General Interventions   General Interventions Discussed/Reviewed General Interventions Reviewed, [employer], dental services]  Doctor Visits Discussed/Reviewed Doctor Visits Reviewed, Specialist  PCP/Specialist Visits Compliance with follow-up visit  [pending dental services]          manage pain of both hands (THN) (pt-stated)   On track     Care Coordination Interventions:   encouragement provided Confirmed disability still pending      New dental provider [facility]) (pt-stated)   On track     Care Coordination Interventions: Pending [facility] dentist (Dr Joya Gaskins ) scheduled appointment on Wednesday April 11 2022 at 2:30 pm & oral surgery visit on 06/30/22         Our next appointment is by telephone on 05/01/22 at 3 pm  Please call the care guide team at [phone] if you need to cancel or reschedule your appointment.   If you are experiencing a Mental Health or [place] or need someone to talk to, please call the Suicide and Crisis Lifeline: 988 call the [place] National Suicide Prevention Lifeline: (838)071-2243 or TTY: 620-140-6156 TTY 340-343-8046) to talk to a trained counselor call 1-800-273-TALK (toll free, 24 hour hotline) call the  [facility]: 930-577-3532 call 911   Patient verbalizes understanding of instructions and care plan provided today and agrees to view in [place]. Active MyChart status and patient understanding of how to access instructions and care plan via MyChart confirmed with patient.     The patient has been provided with contact information for the care management team and has been advised to call with any health related questions or concerns.    Azir Muzyka L. Lavina Hamman, RN, BSN, [place] Coordinator Office number [phone]

## 2022-03-24 NOTE — Patient Outreach (Signed)
  Care Coordination   Follow Up Visit Note   03/24/2022 Name: Cristina Harris MRN: [id number] DOB: 07-18-1965  Cristina Harris is a 57 y.o. year old female who sees Doren Custard, Hazle Nordmann, MD for primary care. I spoke with  [name] by phone today.  What matters to the patients health and wellness today?  Received a job and is doing well  Still pending dental     Goals Addressed               This Visit's Progress     Patient Stated     Research officer, trade union for counseling, weight loss (THN) (pt-stated)   On track     Care Coordination Interventions: Discussed plans with patient for ongoing care management follow up and provided patient with direct contact information for care management team Encouragement provided,  Interventions Today    Flowsheet Row Most Recent Value  Chronic Disease   Chronic disease during today's visit Other  [dental and disability/job]  General Interventions   General Interventions Discussed/Reviewed General Interventions Reviewed, [employer], dental services]  Doctor Visits Discussed/Reviewed Doctor Visits Reviewed, Specialist  PCP/Specialist Visits Compliance with follow-up visit  [pending dental services]          manage pain of both hands (THN) (pt-stated)   On track     Care Coordination Interventions:   encouragement provided Confirmed disability still pending      New dental provider [facility]) (pt-stated)   On track     Care Coordination Interventions: Pending [facility] dentist (Dr Joya Gaskins ) scheduled appointment on Wednesday April 11 2022 at 2:30 pm & oral surgery visit on 06/30/22         SDOH assessments and interventions completed:  No     Care Coordination Interventions:  Yes, provided   Follow up plan: Follow up call scheduled for 05/01/22    Encounter Outcome:  Pt. Visit Completed   Daralyn Bert L. Lavina Hamman, RN, BSN, [place] Coordinator Office number [phone]

## 2022-04-06 ENCOUNTER — Ambulatory Visit: Payer: Medicaid Other | Admitting: Internal Medicine

## 2022-04-06 ENCOUNTER — Encounter: Payer: Self-pay | Admitting: Internal Medicine

## 2022-04-14 ENCOUNTER — Ambulatory Visit: Payer: Medicaid Other | Admitting: Internal Medicine

## 2022-04-14 ENCOUNTER — Encounter: Payer: Self-pay | Admitting: Internal Medicine

## 2022-04-14 VITALS — BP 154/84 | HR 81 | Ht 64.0 in | Wt 203.6 lb

## 2022-04-14 DIAGNOSIS — E669 Obesity, unspecified: Secondary | ICD-10-CM | POA: Diagnosis not present

## 2022-04-14 DIAGNOSIS — E782 Mixed hyperlipidemia: Secondary | ICD-10-CM | POA: Diagnosis not present

## 2022-04-14 MED ORDER — ATORVASTATIN CALCIUM 40 MG PO TABS: 40.0000 mg | ORAL_TABLET | Freq: Every day | ORAL | 3 refills | Status: DC

## 2022-04-14 MED ORDER — PHENTERMINE HCL 15 MG PO CAPS: 15.0000 mg | ORAL_CAPSULE | ORAL | 0 refills | Status: DC

## 2022-04-14 NOTE — Progress Notes (Signed)
Established Patient Office Visit  Subjective   Patient ID: Cristina Harris, female    DOB: 1966-01-15  Age: 57 y.o. MRN: [id number]  Chief Complaint  Patient presents with   Weight Loss    Follow up   Gynecologic Exam   [name] returns to care today for weight loss follow-up.  She was last seen by me on 2/9.  Phentermine was refilled at that time.  There have been no acute interval events.  [name] reports feeling well today.  She has no additional concerns to discuss.  She has lost 17 pounds since starting phentermine.  Past Medical History:  Diagnosis Date   Carpal tunnel syndrome on both sides    Past Surgical History:  Procedure Laterality Date   CARPAL TUNNEL RELEASE Right 2022   COLONOSCOPY WITH PROPOFOL N/A 02/14/2022   Procedure: COLONOSCOPY WITH PROPOFOL;  Surgeon: Eloise Harman, DO;  [facility];  Service: Endoscopy;  Laterality: N/A;  1:30 pm, pt knows to arrive at 7:00   NASAL FRACTURE SURGERY     POLYPECTOMY  02/14/2022   Procedure: POLYPECTOMY;  Surgeon: Eloise Harman, DO;  Location: [facility];  Service: Endoscopy;;   TUBAL LIGATION     WRIST ARTHROSCOPY Right 2022   Social History   Tobacco Use   Smoking status: Former    Types: Cigarettes    Quit date: 04/2021    Years since quitting: 1.0    Passive exposure: Past   Smokeless tobacco: Never   Tobacco comments:    Started smoking at age 64, quit smoking 2023.   Vaping Use   Vaping Use: Former  Substance Use Topics   Alcohol use: Yes    Comment: occ   Drug use: No   Family History  Problem Relation Age of Onset   Breast cancer Mother    Heart failure Father    Diabetes type II Father    Heart failure Brother    Lupus Daughter    Diabetes type II Maternal Grandmother    Colon cancer Neg Hx    Cervical cancer Neg Hx    No Known Allergies  Review of Systems  Constitutional:  Negative for chills and fever.  HENT:  Negative for sore throat.   Respiratory:  Negative  for cough and shortness of breath.   Cardiovascular:  Negative for chest pain, palpitations and leg swelling.  Gastrointestinal:  Negative for abdominal pain, blood in stool, constipation, diarrhea, nausea and vomiting.  Genitourinary:  Negative for dysuria and hematuria.  Musculoskeletal:  Negative for myalgias.  Skin:  Negative for itching and rash.  Neurological:  Negative for dizziness and headaches.  Psychiatric/Behavioral:  Negative for depression and suicidal ideas.      Objective:     BP (!) 154/84   Pulse 81   Ht '5\' 4"'$  (1.626 m)   Wt 203 lb 9.6 oz (92.4 kg)   LMP 03/06/2013   SpO2 97%   BMI 34.95 kg/m  BP Readings from Last 3 Encounters:  04/14/22 (!) 154/84  03/17/22 133/86  02/17/22 137/89   Physical Exam Vitals reviewed.  Constitutional:      General: She is not in acute distress.    Appearance: Normal appearance. She is obese. She is not toxic-appearing.  HENT:     Head: Normocephalic and atraumatic.     Nose: Nose normal. No congestion or rhinorrhea.     Mouth/Throat:     Mouth: Mucous membranes are moist.  Pharynx: Oropharynx is clear. No oropharyngeal exudate or posterior oropharyngeal erythema.  Eyes:     General: No scleral icterus.    Extraocular Movements: Extraocular movements intact.     Conjunctiva/sclera: Conjunctivae normal.     Pupils: Pupils are equal, round, and reactive to light.  Cardiovascular:     Rate and Rhythm: Normal rate and regular rhythm.     Pulses: Normal pulses.     Heart sounds: Normal heart sounds. No murmur heard.    No friction rub. No gallop.  Pulmonary:     Effort: Pulmonary effort is normal. No respiratory distress.     Breath sounds: Normal breath sounds. No wheezing, rhonchi or rales.  Abdominal:     General: Abdomen is flat. Bowel sounds are normal. There is no distension.     Palpations: Abdomen is soft.     Tenderness: There is no abdominal tenderness.  Musculoskeletal:        General: No swelling or  deformity.     Cervical back: Normal range of motion.     Right lower leg: No edema.     Left lower leg: No edema.  Skin:    General: Skin is warm and dry.     Capillary Refill: Capillary refill takes less than 2 seconds.     Coloration: Skin is not jaundiced.  Neurological:     General: No focal deficit present.     Mental Status: She is alert and oriented to person, place, and time.     Motor: No weakness.     Gait: Gait normal.  Psychiatric:        Mood and Affect: Mood normal.        Behavior: Behavior normal.        Thought Content: Thought content normal.        Judgment: Judgment normal.   Last CBC Lab Results  Component Value Date   WBC 5.9 07/11/2021   HGB 12.8 07/11/2021   HCT 37.6 07/11/2021   MCV 90 07/11/2021   MCH 30.7 07/11/2021   RDW 13.0 07/11/2021   PLT 374 0000000   Last metabolic panel Lab Results  Component Value Date   GLUCOSE 86 07/11/2021   NA 145 (H) 07/11/2021   K 4.3 07/11/2021   CL 107 (H) 07/11/2021   CO2 24 07/11/2021   BUN 13 07/11/2021   CREATININE 0.89 07/11/2021   EGFR 76 07/11/2021   CALCIUM 9.3 07/11/2021   PROT 7.6 07/11/2021   ALBUMIN 4.1 07/11/2021   LABGLOB 3.5 07/11/2021   AGRATIO 1.2 07/11/2021   BILITOT 0.4 07/11/2021   ALKPHOS 72 07/11/2021   AST 13 07/11/2021   ALT 21 07/11/2021   ANIONGAP 4 (L) 07/06/2016   Last lipids Lab Results  Component Value Date   CHOL 220 (H) 07/11/2021   HDL 60 07/11/2021   LDLCALC 148 (H) 07/11/2021   TRIG 71 07/11/2021   CHOLHDL 3.7 07/11/2021   Last hemoglobin A1c Lab Results  Component Value Date   HGBA1C 5.5 07/11/2021   Last thyroid functions Lab Results  Component Value Date   TSH 1.000 07/11/2021   Last vitamin D Lab Results  Component Value Date   VD25OH 20.9 (L) 10/17/2021   The 10-year ASCVD risk score (Arnett DK, et al., 2019) is: 20.4%    Assessment & Plan:   Problem List Items Addressed This Visit       Obesity (BMI 30-39.9)    Returning to care  today for weight loss  follow-up.  She remains on phentermine 15 mg daily.  In total she has lost 17 pounds since starting phentermine in November.  She continues to exercise regularly and monitor her diet. -Phentermine refilled today -I again reviewed with [name] that phentermine will be discontinued in May.  She is in agreement with this plan.  Continued lifestyle modifications aimed at weight loss were reinforced as well.      Hyperlipidemia - Primary    Lipid panel updated in June 2023.  Total cholesterol 220 and LDL 148.  Her 10-year ASCVD risk or today is 20.4%. -Through shared decision making, atorvastatin 40 mg daily has been started -Follow-up in 2 months       Return in about 2 months (around 06/14/2022).    Johnette Abraham, MD

## 2022-04-14 NOTE — Assessment & Plan Note (Signed)
Lipid panel updated in June 2023.  Total cholesterol 220 and LDL 148.  Her 10-year ASCVD risk or today is 20.4%. -Through shared decision making, atorvastatin 40 mg daily has been started -Follow-up in 2 months

## 2022-04-14 NOTE — Assessment & Plan Note (Signed)
Returning to care today for weight loss follow-up.  She remains on phentermine 15 mg daily.  In total she has lost 17 pounds since starting phentermine in November.  She continues to exercise regularly and monitor her diet. -Phentermine refilled today -I again reviewed with Cristina Harris that phentermine will be discontinued in May.  She is in agreement with this plan.  Continued lifestyle modifications aimed at weight loss were reinforced as well.

## 2022-04-14 NOTE — Patient Instructions (Signed)
It was a pleasure to see you today.  Thank you for giving [place] the opportunity to be involved in your care.  Below is a brief recap of your visit and next steps.  We will plan to see you again in 2 months.  Summary Phentermine refilled Start atorvastatin 40 mg daily for high cholesterol Follow up in 2 months for weight loss, blood pressure, and to have your pap smear completed

## 2022-04-18 ENCOUNTER — Encounter: Payer: Self-pay | Admitting: Internal Medicine

## 2022-04-18 ENCOUNTER — Ambulatory Visit: Payer: Medicaid Other | Admitting: Internal Medicine

## 2022-04-18 VITALS — BP 138/94 | HR 94 | Ht 64.0 in | Wt 207.8 lb

## 2022-04-18 DIAGNOSIS — R200 Anesthesia of skin: Secondary | ICD-10-CM | POA: Diagnosis not present

## 2022-04-18 MED ORDER — GABAPENTIN 300 MG PO CAPS: 300.0000 mg | ORAL_CAPSULE | Freq: Three times a day (TID) | ORAL | 3 refills | Status: DC

## 2022-04-18 NOTE — Progress Notes (Signed)
   Acute Office Visit  Subjective:     Patient ID: Cristina Harris, female    DOB: 1965/06/20, 57 y.o.   MRN: [id number]  Chief Complaint  Patient presents with   Leg Pain    Patient states both of her legs are numb and stinging, painful.   [name] presents today for an acute visit for evaluation of bilateral numbness/burning pain in her legs.  She endorses chronic numbness and pain in the lateral aspect of her left leg, but reports onset of numbness and pain in the lateral aspect of her right leg last night (3/11).  There is no inciting event or trauma at the onset of pain.  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  She believes this may have exacerbated her symptoms.  She denies pain in her lower back as well as symptoms of saddle anesthesia, bowel/bladder incontinence, unintentional weight loss, fever/chills, and night sweats.  Review of Systems  Neurological:  Positive for tingling (Numbness/tingling and burning pain in the lower extremities bilaterally).  All other systems reviewed and are negative.     Objective:    BP (!) 138/94 (BP Location: Right Arm, Patient Position: Sitting, Cuff Size: Large)   Pulse 94   Ht 5\' 4"  (1.626 m)   Wt 207 lb 12.8 oz (94.3 kg)   LMP 03/06/2013   SpO2 98%   BMI 35.67 kg/m   Physical Exam Vitals reviewed.  Constitutional:      Appearance: She is obese.  Neurological:     General: No focal deficit present.     Mental Status: She is oriented to person, place, and time.     Sensory: No sensory deficit.     Motor: No weakness.     Coordination: Coordination normal.     Gait: Gait normal.     Deep Tendon Reflexes: Reflexes normal.       Assessment & Plan:   Problem List Items Addressed This Visit       Lower extremity numbness - Primary    She presents today for evaluation of bilateral lower extremity pain and numbness.  She endorses chronic pain/numbness in the lateral aspect of the left  lower extremity radiating distally just below the knee.  Symptoms began in her right leg last night and are mostly located in the proximal/lateral aspect of the right leg.  Her exam is rather unremarkable.  I do not appreciate any discrepancy in sensation.  Strength is intact in the lower extremities.  Reflexes are normal.  Gait is normal.  ROM is intact at the waist.  SLR is negative bilaterally.  Symptoms may be related to meralgia paresthetica given that she is obese. -Gabapentin 300 mg 3 times daily prescribed for symptom relief -She will return to care if her symptoms worsen or fail to improve.  Otherwise she has previously scheduled follow-up with me in May.      Meds ordered this encounter  Medications   gabapentin (NEURONTIN) 300 MG capsule    Sig: Take 1 capsule (300 mg total) by mouth 3 (three) times daily.    Dispense:  90 capsule    Refill:  3    Return if symptoms worsen or fail to improve.  Johnette Abraham, MD

## 2022-04-18 NOTE — Patient Instructions (Signed)
It was a pleasure to see you today.  Thank you for giving [place] the opportunity to be involved in your care.  Below is a brief recap of your visit and next steps.  We will plan to see you again in May.  Summary Start gabapentin 300 mg three times daily for symptom relief Return to care if your symptoms are not improving or are getting worse. Otherwise I will see you for follow up in May.

## 2022-04-18 NOTE — Assessment & Plan Note (Addendum)
She presents today for evaluation of bilateral lower extremity pain and numbness.  She endorses chronic pain/numbness in the lateral aspect of the left lower extremity radiating distally just below the knee.  Symptoms began in her right leg last night and are mostly located in the proximal/lateral aspect of the right leg.  Her exam is rather unremarkable.  I do not appreciate any discrepancy in sensation.  Strength is intact in the lower extremities.  Reflexes are normal.  Gait is normal.  ROM is intact at the waist.  SLR is negative bilaterally.  Symptoms may be related to meralgia paresthetica given that she is obese. -Gabapentin 300 mg 3 times daily prescribed for symptom relief -She will return to care if her symptoms worsen or fail to improve.  Otherwise she has previously scheduled follow-up with me in May.

## 2022-04-24 IMAGING — XA DG FLUORO GUIDE NDL PLC/BX
1 series · 1 of 1 positions shown · non-contrast
Comparison: none

CLINICAL DATA: Right wrist pain. Triangular fibrocartilage complex
tear.

[Series 1: ortho standard · 1 of 1 slices shown]
[im 1/1]
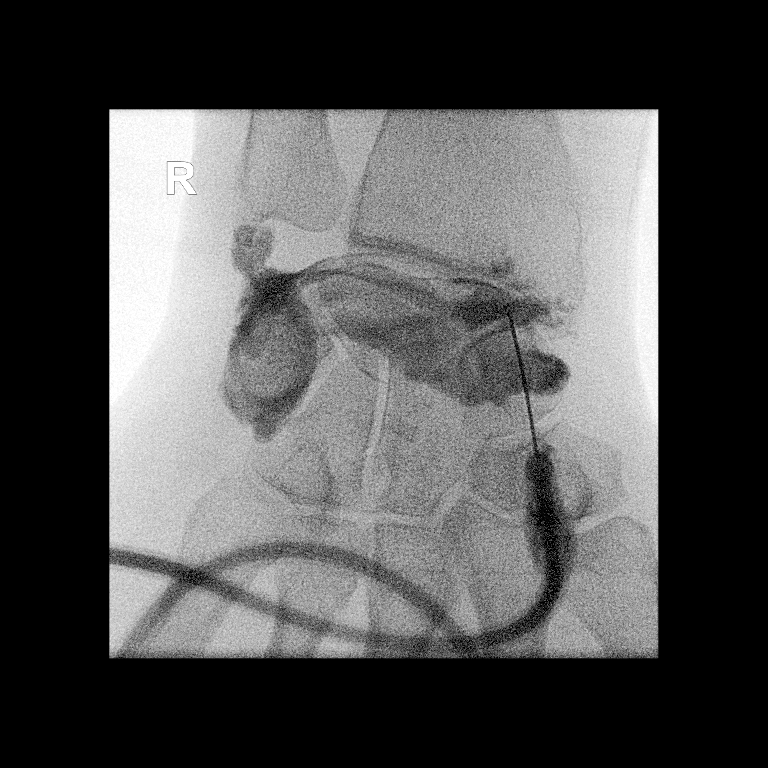

[1 of 1 positions shown; findings below may reference images not displayed]

FLUOROSCOPY TIME:  Fluoroscopy Time: 9 seconds

Radiation Exposure Index: 0.19 microGray*m^2

PROCEDURE:
RIGHT WRIST INJECTION UNDER FLUOROSCOPY

An appropriate skin entrance site was determined. The site was
marked, prepped with Betadine, draped in the usual sterile fashion,
and infiltrated locally with 1% Lidocaine. A 25 gauge skin needle
was advanced into the radiocarpal joint under intermittent
fluoroscopy. A mixture of 0.1 mL of MultiHance, 15 mL of Isovue-M
200, and 5 mL of sterile saline was then used to opacify the
proximal carpal joint. 2 mL of this mixture were injected. No
immediate complication.
IMPRESSION: Technically successful right wrist injection for MRI.

## 2022-05-01 ENCOUNTER — Ambulatory Visit: Payer: Self-pay | Admitting: *Deleted

## 2022-05-01 NOTE — Patient Instructions (Addendum)
Visit Information  Thank you for taking time to visit with me today. Please don't hesitate to contact me if I can be of assistance to you.   Following are the goals we discussed today:   Goals Addressed               This Visit's Progress     Patient Stated     COMPLETED: manage pain of both hands & both Lower extremities (THN) (pt-stated)   Not on track     Care Coordination Interventions: Interventions Today    Flowsheet Row Most Recent Value  Chronic Disease   Chronic disease during today's visit Other  [follow up ED injury, pending disability]  General Interventions   General Interventions Discussed/Reviewed General Interventions Reviewed, Doctor Visits  Doctor Visits Discussed/Reviewed Doctor Visits Reviewed, PCP, Specialist  PCP/Specialist Visits Compliance with follow-up visit  Exercise Interventions   Exercise Discussed/Reviewed Physical Activity, Exercise Reviewed  Physical Activity Discussed/Reviewed Physical Activity Reviewed  Education Interventions   Education Provided Provided Education  [passive exercises]  Mental Health Interventions   Mental Health Discussed/Reviewed Coping Strategies, Mental Health Reviewed             Our next appointment is by telephone on pending services changes at [facility]  Please call the care guide team at [phone] if you need to cancel or reschedule your appointment.   If you are experiencing a Mental Health or Behavioral Health Crisis or need someone to talk to, please call the Suicide and Crisis Lifeline: 988 call the [place] National Suicide Prevention Lifeline: 279-821-3333 or TTY: 210 211 7005 TTY 4436430312) to talk to a trained counselor call 1-800-273-TALK (toll free, 24 hour hotline) call the [facility]: [phone] call 911   Patient verbalizes understanding of instructions and care plan provided today and agrees to view in MyChart. Active MyChart status and patient understanding of how to  access instructions and care plan via MyChart confirmed with patient.     The patient has been provided with contact information for the care management team and has been advised to call with any health related questions or concerns.   Malone Admire L. Noelle Penner, RN, BSN, CCM [facility] Care Management Community Coordinator Office number [phone]

## 2022-05-01 NOTE — Patient Outreach (Signed)
  Care Coordination   Follow Up Visit Note   07/13/2022 Name: Cristina Harris MRN: [id number] DOB: Jan 16, 1966  Cristina Harris is a 57 y.o. year old female who sees Durwin Nora, Lucina Mellow, MD for primary care. I spoke with  [name] by phone today.  What matters to the patients health and wellness today?  Recent ED, injury from her previous job- bilateral legs burning, numbness, stinging ; still with disability (no legal representation)   Jul 02 2022 will be when she gets a response after being Out of work for about 2 1/2 years   Goals Addressed               This Visit's Progress     Patient Stated     manage pain of both hands & both Lower extremities (THN) (pt-stated)   Not on track     Care Coordination Interventions: Interventions Today    Flowsheet Row Most Recent Value  Chronic Disease   Chronic disease during today's visit Other  [follow up ED injury, pending disability]  General Interventions   General Interventions Discussed/Reviewed General Interventions Reviewed, Doctor Visits  Doctor Visits Discussed/Reviewed Doctor Visits Reviewed, PCP, Specialist  PCP/Specialist Visits Compliance with follow-up visit  Exercise Interventions   Exercise Discussed/Reviewed Physical Activity, Exercise Reviewed  Physical Activity Discussed/Reviewed Physical Activity Reviewed  Education Interventions   Education Provided Provided Education  [passive exercises]  Mental Health Interventions   Mental Health Discussed/Reviewed Coping Strategies, Mental Health Reviewed             SDOH assessments and interventions completed:  No     Care Coordination Interventions:  Yes, provided   Follow up plan: Follow up call scheduled for pending [facility] services changes - Medicaid     Encounter Outcome:  Pt. Visit Completed   Gita Dilger L. Noelle Penner, RN, BSN, CCM [facility] Care Management Community Coordinator Office number [phone]

## 2022-05-02 ENCOUNTER — Ambulatory Visit: Payer: Medicaid Other | Admitting: Internal Medicine

## 2022-05-02 ENCOUNTER — Encounter: Payer: Self-pay | Admitting: Internal Medicine

## 2022-05-02 VITALS — BP 131/86 | HR 78 | Ht 64.0 in | Wt 209.2 lb

## 2022-05-02 DIAGNOSIS — M5416 Radiculopathy, lumbar region: Secondary | ICD-10-CM

## 2022-05-02 DIAGNOSIS — R200 Anesthesia of skin: Secondary | ICD-10-CM

## 2022-05-02 DIAGNOSIS — M4726 Other spondylosis with radiculopathy, lumbar region: Secondary | ICD-10-CM
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
## 2022-05-02 NOTE — Patient Instructions (Signed)
It was a pleasure to see you today.  Thank you for giving [place] the opportunity to be involved in your care.  Below is a brief recap of your visit and next steps.  We will plan to see you again in May.  Summary MRI of your lumbar spine ordered today Neurosurgery referral placed Follow up in May
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
## 2022-05-03 ENCOUNTER — Telehealth: Payer: Self-pay | Admitting: Internal Medicine

## 2022-05-03 NOTE — Telephone Encounter (Signed)
Patient called and wants to go to  [facility] for the referral does not want [place], patient call back # [phone].

## 2022-05-05 ENCOUNTER — Other Ambulatory Visit (HOSPITAL_COMMUNITY)
Admission: RE | Admit: 2022-05-05 | Discharge: 2022-05-05 | Disposition: A | Discharge status: Home / Self Care | Payer: Medicare Other | Source: Ambulatory Visit | Attending: Family Medicine | Admitting: Family Medicine

## 2022-05-05 ENCOUNTER — Ambulatory Visit: Payer: Medicaid Other | Admitting: Family Medicine

## 2022-05-05 ENCOUNTER — Encounter: Payer: Self-pay | Admitting: Family Medicine

## 2022-05-05 VITALS — BP 131/87 | HR 94 | Ht 64.0 in | Wt 212.0 lb

## 2022-05-05 DIAGNOSIS — Z1151 Encounter for screening for human papillomavirus (HPV): Secondary | ICD-10-CM | POA: Insufficient documentation

## 2022-05-05 DIAGNOSIS — Z01419 Encounter for gynecological examination (general) (routine) without abnormal findings: Secondary | ICD-10-CM | POA: Diagnosis present

## 2022-05-05 DIAGNOSIS — N959 Unspecified menopausal and perimenopausal disorder: Secondary | ICD-10-CM | POA: Insufficient documentation

## 2022-05-05 DIAGNOSIS — Z124 Encounter for screening for malignant neoplasm of cervix: Secondary | ICD-10-CM | POA: Diagnosis not present

## 2022-05-05 NOTE — Patient Instructions (Signed)
It was pleasure meeting with you today. Follow up with your primary health provider if any health concerns arises.   

## 2022-05-05 NOTE — Assessment & Plan Note (Signed)
Pap smear done, Patient tolerated procedure well. Informed patient I will keep them updated on results. Left and right breasts appear normal, no suspicious masses, no skin or nipple changes or axillary nodes.   

## 2022-05-05 NOTE — Progress Notes (Signed)
Patient Office Visit   Subjective   Patient ID: Cristina Harris, female    DOB: 1965/10/01  Age: 57 y.o. MRN: [id number]  CC:  Chief Complaint  Patient presents with   Annual Exam    HPI Cristina Harris 57 year old female, presents to clinic for pap smear. She  has a past medical history of Carpal tunnel syndrome on both sides.  HPI   Outpatient Encounter Medications as of 05/05/2022  Medication Sig   acetaminophen (TYLENOL) 500 MG tablet Take 500-1,000 mg by mouth every 6 (six) hours as needed (pain).   albuterol (VENTOLIN HFA) 108 (90 Base) MCG/ACT inhaler INHALE 1 PUFF INTO THE LUNGS AS NEEDED FOR WHEEZING OR SHORTNESS OF BREATH   amLODipine (NORVASC) 5 MG tablet Take 1 tablet (5 mg total) by mouth daily.   atorvastatin (LIPITOR) 40 MG tablet Take 1 tablet (40 mg total) by mouth daily.   gabapentin (NEURONTIN) 300 MG capsule Take 1 capsule (300 mg total) by mouth 3 (three) times daily.   ibuprofen (ADVIL) 800 MG tablet Take 800 mg by mouth daily as needed (headache).   phentermine 15 MG capsule Take 1 capsule (15 mg total) by mouth every morning.   No facility-administered encounter medications on file as of 05/05/2022.    Past Surgical History:  Procedure Laterality Date   CARPAL TUNNEL RELEASE Right 2022   COLONOSCOPY WITH PROPOFOL N/A 02/14/2022   Procedure: COLONOSCOPY WITH PROPOFOL;  Surgeon: Eloise Harman, DO;  Location: [facility];  Service: Endoscopy;  Laterality: N/A;  1:30 pm, pt knows to arrive at 7:00   NASAL FRACTURE SURGERY     POLYPECTOMY  02/14/2022   Procedure: POLYPECTOMY;  Surgeon: Eloise Harman, DO;  Location: [facility];  Service: Endoscopy;;   TUBAL LIGATION     WRIST ARTHROSCOPY Right 2022    Review of Systems  Constitutional:  Negative for chills and fever.  Respiratory:  Negative for shortness of breath.   Cardiovascular:  Negative for chest pain.  Genitourinary:  Negative for dysuria, flank pain, frequency, hematuria and urgency.   Neurological:  Negative for dizziness and headaches.      Objective    BP 131/87   Pulse 94   Ht 5\' 4"  (1.626 m)   Wt 212 lb (96.2 kg)   LMP 03/06/2013   SpO2 97%   BMI 36.39 kg/m   Physical Exam HENT:     Head: Normocephalic.  Cardiovascular:     Rate and Rhythm: Normal rate.  Pulmonary:     Effort: Pulmonary effort is normal.  Genitourinary:    General: Normal vulva.     Exam position: Lithotomy position.     Labia:        Right: No rash, tenderness or injury.        Left: No rash, tenderness or injury.      Urethra: No urethral pain, urethral swelling or urethral lesion.     Vagina: Normal. No vaginal discharge or tenderness.     Cervix: Normal.  Musculoskeletal:        General: Normal range of motion.     Cervical back: Normal range of motion.  Skin:    General: Skin is warm and dry.  Neurological:     General: No focal deficit present.     Mental Status: She is alert.     Coordination: Coordination normal.     Gait: Gait normal.  Psychiatric:        Mood and  Affect: Mood normal.        Thought Content: Thought content normal.       Assessment & Plan:  Encounter for Papanicolaou smear for cervical cancer screening -     Cytology - PAP  Encounter for Papanicolaou smear of cervix Assessment & Plan: Pap smear done, Patient tolerated procedure well. Informed patient I will keep them updated on results. Left and right breasts appear normal, no suspicious masses, no skin or nipple changes or axillary nodes.     No follow-ups on file.   Cristina Harris Comment, FNP

## 2022-05-11 LAB — CYTOLOGY - PAP
Comment: NEGATIVE
Diagnosis: NEGATIVE
High risk HPV: NEGATIVE

## 2022-05-19 ENCOUNTER — Ambulatory Visit
Admission: RE | Admit: 2022-05-19 | Discharge: 2022-05-19 | Disposition: A | Discharge status: Home / Self Care | Payer: Medicaid Other | Source: Ambulatory Visit | Attending: Internal Medicine | Admitting: Internal Medicine

## 2022-05-19 DIAGNOSIS — M5416 Radiculopathy, lumbar region: Secondary | ICD-10-CM

## 2022-05-19 DIAGNOSIS — R200 Anesthesia of skin: Secondary | ICD-10-CM

## 2022-05-30 ENCOUNTER — Telehealth: Payer: Self-pay | Admitting: Internal Medicine

## 2022-05-30 ENCOUNTER — Other Ambulatory Visit: Payer: Self-pay | Admitting: Internal Medicine

## 2022-05-30 DIAGNOSIS — E669 Obesity, unspecified: Secondary | ICD-10-CM

## 2022-05-30 MED ORDER — PHENTERMINE HCL 15 MG PO CAPS: 15.0000 mg | ORAL_CAPSULE | ORAL | 0 refills | Status: DC

## 2022-05-30 NOTE — Telephone Encounter (Signed)
Pt called in regards to refill  phentermine 15 MG capsule [[id number]]

## 2022-06-30 ENCOUNTER — Encounter: Payer: Self-pay | Admitting: Internal Medicine

## 2022-06-30 ENCOUNTER — Ambulatory Visit: Payer: Medicaid Other | Admitting: Internal Medicine

## 2022-06-30 VITALS — BP 132/88 | HR 87 | Ht 64.0 in | Wt 208.4 lb

## 2022-06-30 DIAGNOSIS — J45909 Unspecified asthma, uncomplicated: Secondary | ICD-10-CM

## 2022-06-30 DIAGNOSIS — I10 Essential (primary) hypertension: Secondary | ICD-10-CM

## 2022-06-30 DIAGNOSIS — E782 Mixed hyperlipidemia: Secondary | ICD-10-CM | POA: Diagnosis not present

## 2022-06-30 DIAGNOSIS — E559 Vitamin D deficiency, unspecified: Secondary | ICD-10-CM

## 2022-06-30 DIAGNOSIS — E669 Obesity, unspecified: Secondary | ICD-10-CM

## 2022-06-30 MED ORDER — PHENTERMINE HCL 15 MG PO CAPS: 15.0000 mg | ORAL_CAPSULE | ORAL | 0 refills | Status: DC

## 2022-06-30 MED ORDER — ALBUTEROL SULFATE HFA 108 (90 BASE) MCG/ACT IN AERS: 1.0000 | INHALATION_SPRAY | RESPIRATORY_TRACT | 1 refills | Status: DC | PRN

## 2022-06-30 NOTE — Assessment & Plan Note (Signed)
Atorvastatin 40 mg daily was initiated in March.  She has not experienced any adverse side effects since starting atorvastatin. -Repeat lipid panel ordered today

## 2022-06-30 NOTE — Progress Notes (Signed)
Established Patient Office Visit  Subjective   Patient ID: Cristina Harris, female    DOB: Jul 28, 1965  Age: 57 y.o. MRN: [id number]  Chief Complaint  Patient presents with   Weight Loss    Follow up   Cristina Harris returns to care today for weight loss follow-up.  Last evaluated by me on 3/26 for an acute visit for evaluation of persistent back pain with bilateral lower extremity pain and numbness.  MRI ordered and neurosurgery referral placed.  There have been no acute interval events and she reports that she returned to care at [facility] for management of back/lower extremity pain.  She has received 2 injections in her back, which has significantly improved her symptoms.  Cristina Harris reports feeling well today.  She is asymptomatic and has no acute concerns to discuss.  Past Medical History:  Diagnosis Date   Carpal tunnel syndrome on both sides    Past Surgical History:  Procedure Laterality Date   CARPAL TUNNEL RELEASE Right 2022   COLONOSCOPY WITH PROPOFOL N/A 02/14/2022   Procedure: COLONOSCOPY WITH PROPOFOL;  Surgeon: Lanelle Bal, DO;  Location: [facility];  Service: Endoscopy;  Laterality: N/A;  1:30 pm, pt knows to arrive at 7:00   NASAL FRACTURE SURGERY     POLYPECTOMY  02/14/2022   Procedure: POLYPECTOMY;  Surgeon: Lanelle Bal, DO;  Location: [facility];  Service: Endoscopy;;   TUBAL LIGATION     WRIST ARTHROSCOPY Right 2022   Social History   Tobacco Use   Smoking status: Former    Types: Cigarettes    Quit date: 04/2021    Years since quitting: 1.2    Passive exposure: Past   Smokeless tobacco: Never   Tobacco comments:    Started smoking at age 51, quit smoking 2023.   Vaping Use   Vaping Use: Former  Substance Use Topics   Alcohol use: Yes    Comment: occ   Drug use: No   Family History  Problem Relation Age of Onset   Breast cancer Mother    Heart failure Father    Diabetes type II Father    Heart failure Brother    Lupus  Daughter    Diabetes type II Maternal Grandmother    Colon cancer Neg Hx    Cervical cancer Neg Hx    No Known Allergies  Review of Systems  Constitutional:  Negative for chills and fever.  HENT:  Negative for sore throat.   Respiratory:  Negative for cough and shortness of breath.   Cardiovascular:  Negative for chest pain, palpitations and leg swelling.  Gastrointestinal:  Negative for abdominal pain, blood in stool, constipation, diarrhea, nausea and vomiting.  Genitourinary:  Negative for dysuria and hematuria.  Musculoskeletal:  Negative for myalgias.  Skin:  Negative for itching and rash.  Neurological:  Negative for dizziness and headaches.  Psychiatric/Behavioral:  Negative for depression and suicidal ideas.      Objective:     BP 132/88   Pulse 87   Ht 5\' 4"  (1.626 m)   Wt 208 lb 6.4 oz (94.5 kg)   LMP 03/06/2013   SpO2 98%   BMI 35.77 kg/m  BP Readings from Last 3 Encounters:  06/30/22 132/88  05/05/22 131/87  05/02/22 131/86   Physical Exam Vitals reviewed.  Constitutional:      General: She is not in acute distress.    Appearance: Normal appearance. She is obese. She is not toxic-appearing.  HENT:  Head: Normocephalic and atraumatic.     Nose: Nose normal. No congestion or rhinorrhea.     Mouth/Throat:     Mouth: Mucous membranes are moist.     Pharynx: Oropharynx is clear. No oropharyngeal exudate or posterior oropharyngeal erythema.  Eyes:     General: No scleral icterus.    Extraocular Movements: Extraocular movements intact.     Conjunctiva/sclera: Conjunctivae normal.     Pupils: Pupils are equal, round, and reactive to light.  Cardiovascular:     Rate and Rhythm: Normal rate and regular rhythm.     Pulses: Normal pulses.     Heart sounds: Normal heart sounds. No murmur heard.    No friction rub. No gallop.  Pulmonary:     Effort: Pulmonary effort is normal. No respiratory distress.     Breath sounds: Normal breath sounds. No wheezing,  rhonchi or rales.  Abdominal:     General: Abdomen is flat. Bowel sounds are normal. There is no distension.     Palpations: Abdomen is soft.     Tenderness: There is no abdominal tenderness.  Musculoskeletal:        General: No swelling or deformity.     Cervical back: Normal range of motion.     Right lower leg: No edema.     Left lower leg: No edema.  Skin:    General: Skin is warm and dry.     Capillary Refill: Capillary refill takes less than 2 seconds.     Coloration: Skin is not jaundiced.  Neurological:     General: No focal deficit present.     Mental Status: She is alert and oriented to person, place, and time.     Motor: No weakness.     Gait: Gait normal.  Psychiatric:        Mood and Affect: Mood normal.        Behavior: Behavior normal.        Thought Content: Thought content normal.        Judgment: Judgment normal.   Last CBC Lab Results  Component Value Date   WBC 5.9 07/11/2021   HGB 12.8 07/11/2021   HCT 37.6 07/11/2021   MCV 90 07/11/2021   MCH 30.7 07/11/2021   RDW 13.0 07/11/2021   PLT 374 07/11/2021   Last metabolic panel Lab Results  Component Value Date   GLUCOSE 86 07/11/2021   NA 145 (H) 07/11/2021   K 4.3 07/11/2021   CL 107 (H) 07/11/2021   CO2 24 07/11/2021   BUN 13 07/11/2021   CREATININE 0.89 07/11/2021   EGFR 76 07/11/2021   CALCIUM 9.3 07/11/2021   PROT 7.6 07/11/2021   ALBUMIN 4.1 07/11/2021   LABGLOB 3.5 07/11/2021   AGRATIO 1.2 07/11/2021   BILITOT 0.4 07/11/2021   ALKPHOS 72 07/11/2021   AST 13 07/11/2021   ALT 21 07/11/2021   ANIONGAP 4 (L) 07/06/2016   Last lipids Lab Results  Component Value Date   CHOL 220 (H) 07/11/2021   HDL 60 07/11/2021   LDLCALC 148 (H) 07/11/2021   TRIG 71 07/11/2021   CHOLHDL 3.7 07/11/2021   Last hemoglobin A1c Lab Results  Component Value Date   HGBA1C 5.5 07/11/2021   Last thyroid functions Lab Results  Component Value Date   TSH 1.000 07/11/2021   Last vitamin D Lab  Results  Component Value Date   VD25OH 20.9 (L) 10/17/2021   The 10-year ASCVD risk score (Arnett DK, et al., 2019) is: 13%  Assessment & Plan:   Problem List Items Addressed This Visit       Benign hypertension    BP remains well-controlled on amlodipine 5 mg daily. -No medication changes today      Asthma    Pulmonary exam today is normal.  She is asymptomatic.  Albuterol refilled today.      Obesity (BMI 30-39.9)    Returning to care today for weight loss follow-up.  Weight today is 208 pounds.  She endorses slight weight gain recently as her ability to exercise has been limited secondary to back/lower extremity issues.  She remains on phentermine 15 mg daily.  PDMP reviewed and is appropriate. -Last refill of phentermine 15 mg daily x 30 days provided today -We again reviewed that this will be the last refill of phentermine and she will need to continue to focus on lifestyle modifications aimed at weight loss in order to maintain the weight that she has lost while on phentermine.      Hyperlipidemia - Primary    Atorvastatin 40 mg daily was initiated in March.  She has not experienced any adverse side effects since starting atorvastatin. -Repeat lipid panel ordered today      Return if symptoms worsen or fail to improve.   [name], MD

## 2022-06-30 NOTE — Assessment & Plan Note (Signed)
Returning to care today for weight loss follow-up.  Weight today is 208 pounds.  She endorses slight weight gain recently as her ability to exercise has been limited secondary to back/lower extremity issues.  She remains on phentermine 15 mg daily.  PDMP reviewed and is appropriate. -Last refill of phentermine 15 mg daily x 30 days provided today -We again reviewed that this will be the last refill of phentermine and she will need to continue to focus on lifestyle modifications aimed at weight loss in order to maintain the weight that she has lost while on phentermine.

## 2022-06-30 NOTE — Assessment & Plan Note (Signed)
BP remains well-controlled on amlodipine 5 mg daily. -No medication changes today

## 2022-06-30 NOTE — Patient Instructions (Signed)
It was a pleasure to see you today.  Thank you for giving [place] the opportunity to be involved in your care.  Below is a brief recap of your visit and next steps.  We will plan to see you again in June  Summary No medication changes today Repeat labs ordered Follow up next month for annual exam

## 2022-06-30 NOTE — Assessment & Plan Note (Signed)
Pulmonary exam today is normal.  She is asymptomatic.  Albuterol refilled today.

## 2022-07-01 LAB — CBC WITH DIFFERENTIAL/PLATELET
Basophils Absolute: 0 10*3/uL (ref 0.0–0.2)
Basos: 1 %
EOS (ABSOLUTE): 0.8 10*3/uL — ABNORMAL HIGH (ref 0.0–0.4)
Eos: 14 %
Hematocrit: 36.2 % (ref 34.0–46.6)
Hemoglobin: 12 g/dL (ref 11.1–15.9)
Immature Grans (Abs): 0 10*3/uL (ref 0.0–0.1)
Immature Granulocytes: 0 %
Lymphocytes Absolute: 2.1 10*3/uL (ref 0.7–3.1)
Lymphs: 40 %
MCH: 30.7 pg (ref 26.6–33.0)
MCHC: 33.1 g/dL (ref 31.5–35.7)
MCV: 93 fL (ref 79–97)
Monocytes Absolute: 0.5 10*3/uL (ref 0.1–0.9)
Monocytes: 9 %
Neutrophils Absolute: 1.9 10*3/uL (ref 1.4–7.0)
Neutrophils: 36 %
Platelets: 401 10*3/uL (ref 150–450)
RBC: 3.91 x10E6/uL (ref 3.77–5.28)
RDW: 13 % (ref 11.7–15.4)
WBC: 5.3 10*3/uL (ref 3.4–10.8)

## 2022-07-01 LAB — HEMOGLOBIN A1C
Est. average glucose Bld gHb Est-mCnc: 120 mg/dL
Hgb A1c MFr Bld: 5.8 % — ABNORMAL HIGH (ref 4.8–5.6)

## 2022-07-01 LAB — LIPID PANEL
Chol/HDL Ratio: 3.1 ratio (ref 0.0–4.4)
Cholesterol, Total: 162 mg/dL (ref 100–199)
HDL: 52 mg/dL (ref >39)
LDL Chol Calc (NIH): 98 mg/dL (ref 0–99)
Triglycerides: 59 mg/dL (ref 0–149)
VLDL Cholesterol Cal: 12 mg/dL (ref 5–40)

## 2022-07-01 LAB — CMP14+EGFR
ALT: 200 IU/L — ABNORMAL HIGH (ref 0–32)
AST: 146 IU/L — ABNORMAL HIGH (ref 0–40)
Albumin/Globulin Ratio: 1.2 (ref 1.2–2.2)
Albumin: 3.8 g/dL (ref 3.8–4.9)
Alkaline Phosphatase: 363 IU/L — ABNORMAL HIGH (ref 44–121)
BUN/Creatinine Ratio: 10 (ref 9–23)
BUN: 9 mg/dL (ref 6–24)
Bilirubin Total: 0.2 mg/dL (ref 0.0–1.2)
CO2: 20 mmol/L (ref 20–29)
Calcium: 9.2 mg/dL (ref 8.7–10.2)
Chloride: 107 mmol/L — ABNORMAL HIGH (ref 96–106)
Creatinine, Ser: 0.87 mg/dL (ref 0.57–1.00)
Globulin, Total: 3.3 g/dL (ref 1.5–4.5)
Glucose: 82 mg/dL (ref 70–99)
Potassium: 4.4 mmol/L (ref 3.5–5.2)
Sodium: 141 mmol/L (ref 134–144)
Total Protein: 7.1 g/dL (ref 6.0–8.5)
eGFR: 77 mL/min/{1.73_m2} (ref >59)

## 2022-07-01 LAB — TSH+FREE T4
Free T4: 1.08 ng/dL (ref 0.82–1.77)
TSH: 1.46 u[IU]/mL (ref 0.450–4.500)

## 2022-07-01 LAB — VITAMIN D 25 HYDROXY (VIT D DEFICIENCY, FRACTURES): Vit D, 25-Hydroxy: 23.9 ng/mL — ABNORMAL LOW (ref 30.0–100.0)

## 2022-07-01 LAB — B12 AND FOLATE PANEL
Folate: 3.4 ng/mL (ref >3.0)
Vitamin B-12: 749 pg/mL (ref 232–1245)

## 2022-07-05 ENCOUNTER — Other Ambulatory Visit: Payer: Self-pay

## 2022-07-05 ENCOUNTER — Other Ambulatory Visit: Payer: Self-pay | Admitting: Internal Medicine

## 2022-07-05 DIAGNOSIS — I10 Essential (primary) hypertension: Secondary | ICD-10-CM

## 2022-07-06 ENCOUNTER — Other Ambulatory Visit: Payer: Self-pay

## 2022-07-06 MED ORDER — IBUPROFEN 800 MG PO TABS
800.0000 mg | ORAL_TABLET | Freq: Every day | ORAL | 1 refills | Status: DC | PRN
  Filled 2022-07-06: qty 30, 10d supply, fill #0
  Filled 2022-09-07: qty 30, 30d supply, fill #0

## 2022-07-07 ENCOUNTER — Other Ambulatory Visit: Payer: Self-pay | Admitting: Internal Medicine

## 2022-07-07 DIAGNOSIS — I10 Essential (primary) hypertension: Secondary | ICD-10-CM

## 2022-07-13 ENCOUNTER — Ambulatory Visit (INDEPENDENT_AMBULATORY_CARE_PROVIDER_SITE_OTHER): Payer: Medicaid Other | Admitting: Internal Medicine

## 2022-07-13 ENCOUNTER — Encounter: Payer: 59 | Admitting: Nurse Practitioner

## 2022-07-13 ENCOUNTER — Ambulatory Visit: Payer: Self-pay | Admitting: *Deleted

## 2022-07-13 ENCOUNTER — Encounter: Payer: Self-pay | Admitting: Internal Medicine

## 2022-07-13 VITALS — BP 140/82 | HR 86 | Ht 64.0 in | Wt 209.8 lb

## 2022-07-13 DIAGNOSIS — Z0001 Encounter for general adult medical examination with abnormal findings: Secondary | ICD-10-CM

## 2022-07-13 DIAGNOSIS — E559 Vitamin D deficiency, unspecified: Secondary | ICD-10-CM

## 2022-07-13 DIAGNOSIS — K760 Fatty (change of) liver, not elsewhere classified: Secondary | ICD-10-CM | POA: Insufficient documentation

## 2022-07-13 DIAGNOSIS — I10 Essential (primary) hypertension: Secondary | ICD-10-CM

## 2022-07-13 DIAGNOSIS — R7401 Elevation of levels of liver transaminase levels: Secondary | ICD-10-CM

## 2022-07-13 DIAGNOSIS — E782 Mixed hyperlipidemia: Secondary | ICD-10-CM | POA: Diagnosis not present

## 2022-07-13 DIAGNOSIS — R748 Abnormal levels of other serum enzymes: Secondary | ICD-10-CM

## 2022-07-13 DIAGNOSIS — R7303 Prediabetes: Secondary | ICD-10-CM | POA: Insufficient documentation

## 2022-07-13 NOTE — Assessment & Plan Note (Signed)
CMP from last month shows elevated alkaline phosphatase (363), AST/ALT elevations as well.  No hyperbilirubinemia. -Repeat CMP today and add GGT.  Further workup pending results.

## 2022-07-13 NOTE — Assessment & Plan Note (Signed)
Presenting today for her annual exam.   -Recent labs from 5/24 were reviewed.  Repeat CMP and GGT ordered as otherwise documented. -Preventative care items are largely up-to-date.  Outstanding zoster vaccination recommended -We will tentatively plan for follow-up in 3 months

## 2022-07-13 NOTE — Assessment & Plan Note (Signed)
Noted on recent labs.  Daily vitamin D supplementation recommended. 

## 2022-07-13 NOTE — Assessment & Plan Note (Signed)
A1c 5.8 on labs from last month.  Lifestyle modifications aimed at improving her blood sugar were reviewed.  She is motivated to lose weight, which I anticipate will improve her A1c as well.

## 2022-07-13 NOTE — Assessment & Plan Note (Signed)
She is currently prescribed amlodipine 5 mg daily.  BP has previously been well-controlled on this regimen.  BP mildly elevated today, likely secondary to dental pain in the setting of a recent dental procedure. -No medication changes today

## 2022-07-13 NOTE — Patient Instructions (Signed)
It was a pleasure to see you today.  Thank you for giving [place] the opportunity to be involved in your care.  Below is a brief recap of your visit and next steps.  We will plan to see you again in 3 months.  Summary Annual exam completed today Repeat labs ordered Follow up in 3 months

## 2022-07-13 NOTE — Progress Notes (Signed)
Complete physical exam  Patient: Cristina Harris   DOB: 15-Nov-1965   57 y.o. Female  MRN: [id number]  Subjective:    Chief Complaint  Patient presents with   Annual Exam    Carmeshia Maulding is a 57 y.o. female who presents today for a complete physical exam. She reports consuming a general diet. The patient does not participate in regular exercise at present. She generally feels fairly well. She reports sleeping poorly. She does not have additional problems to discuss today.    Most recent fall risk assessment:    07/13/2022    2:08 PM  Fall Risk   Falls in the past year? 1  Number falls in past yr: 0  Injury with Fall? 1  Risk for fall due to : Impaired balance/gait  Follow up Falls evaluation completed     Most recent depression screenings:    07/13/2022    2:08 PM 06/30/2022    8:13 AM  PHQ 2/9 Scores  PHQ - 2 Score 0 1  PHQ- 9 Score 5 5   Vision:Within last year and Dental: No current dental problems and Receives regular dental care  Past Medical History:  Diagnosis Date   Carpal tunnel syndrome on both sides    Past Surgical History:  Procedure Laterality Date   CARPAL TUNNEL RELEASE Right 2022   COLONOSCOPY WITH PROPOFOL N/A 02/14/2022   Procedure: COLONOSCOPY WITH PROPOFOL;  Surgeon: Lanelle Bal, DO;  [facility];  Service: Endoscopy;  Laterality: N/A;  1:30 pm, pt knows to arrive at 7:00   NASAL FRACTURE SURGERY     POLYPECTOMY  02/14/2022   Procedure: POLYPECTOMY;  Surgeon: Lanelle Bal, DO;  [facility];  Service: Endoscopy;;   TUBAL LIGATION     WRIST ARTHROSCOPY Right 2022   Social History   Tobacco Use   Smoking status: Former    Types: Cigarettes    Quit date: 04/2021    Years since quitting: 1.2    Passive exposure: Past   Smokeless tobacco: Never   Tobacco comments:    Started smoking at age 57, quit smoking 2023.   Vaping Use   Vaping Use: Former  Substance Use Topics   Alcohol use: Yes    Comment: occ    Drug use: No   Family History  Problem Relation Age of Onset   Breast cancer Mother    Heart failure Father    Diabetes type II Father    Heart failure Brother    Lupus Daughter    Diabetes type II Maternal Grandmother    Colon cancer Neg Hx    Cervical cancer Neg Hx    No Known Allergies    Patient Care Team: [name], MD as PCP - General (Internal Medicine) Clinton Gallant, RN as [facility] Care Management   Outpatient Medications Prior to Visit  Medication Sig   albuterol (VENTOLIN HFA) 108 (90 Base) MCG/ACT inhaler Inhale 1 puff into the lungs as needed for wheezing or shortness of breath.   amLODipine (NORVASC) 5 MG tablet TAKE 1 TABLET (5 MG TOTAL) BY MOUTH DAILY.   atorvastatin (LIPITOR) 40 MG tablet Take 1 tablet (40 mg total) by mouth daily.   chlorhexidine (PERIDEX) 0.12 % solution SMARTSIG:By Mouth   ibuprofen (ADVIL) 800 MG tablet Take 1 tablet (800 mg total) by mouth daily as needed (headache).   naproxen (NAPROSYN) 500 MG tablet Take 500 mg by mouth 2 (two) times daily.  phentermine 15 MG capsule Take 1 capsule (15 mg total) by mouth every morning.   tiZANidine (ZANAFLEX) 2 MG tablet Take by mouth.   [DISCONTINUED] acetaminophen (TYLENOL) 500 MG tablet Take 500-1,000 mg by mouth every 6 (six) hours as needed (pain).   [DISCONTINUED] gabapentin (NEURONTIN) 300 MG capsule Take 1 capsule (300 mg total) by mouth 3 (three) times daily.   [DISCONTINUED] penicillin v potassium (VEETID) 500 MG tablet Take 500 mg by mouth 4 (four) times daily.   No facility-administered medications prior to visit.   Review of Systems  Musculoskeletal:  Positive for back pain.       Dental pain  All other systems reviewed and are negative.     Objective:     BP (!) 140/82   Pulse 86   Ht 5\' 4"  (1.626 m)   Wt 209 lb 12.8 oz (95.2 kg)   LMP 03/06/2013   SpO2 97%   BMI 36.01 kg/m  BP Readings from Last 3 Encounters:  07/13/22 (!) 140/82  06/30/22 132/88   05/05/22 131/87   Physical Exam Vitals reviewed.  Constitutional:      General: She is not in acute distress.    Appearance: Normal appearance. She is obese. She is not toxic-appearing.  HENT:     Head: Normocephalic and atraumatic.     Nose: Nose normal. No congestion or rhinorrhea.     Mouth/Throat:     Mouth: Mucous membranes are moist.     Pharynx: Oropharynx is clear. No oropharyngeal exudate or posterior oropharyngeal erythema.  Eyes:     General: No scleral icterus.    Extraocular Movements: Extraocular movements intact.     Conjunctiva/sclera: Conjunctivae normal.     Pupils: Pupils are equal, round, and reactive to light.  Cardiovascular:     Rate and Rhythm: Normal rate and regular rhythm.     Pulses: Normal pulses.     Heart sounds: Normal heart sounds. No murmur heard.    No friction rub. No gallop.  Pulmonary:     Effort: Pulmonary effort is normal. No respiratory distress.     Breath sounds: Normal breath sounds. No wheezing, rhonchi or rales.  Abdominal:     General: Abdomen is flat. Bowel sounds are normal. There is no distension.     Palpations: Abdomen is soft.     Tenderness: There is no abdominal tenderness.  Musculoskeletal:        General: No swelling or deformity.     Cervical back: Normal range of motion.     Right lower leg: No edema.     Left lower leg: No edema.  Skin:    General: Skin is warm and dry.     Capillary Refill: Capillary refill takes less than 2 seconds.     Coloration: Skin is not jaundiced.  Neurological:     General: No focal deficit present.     Mental Status: She is alert and oriented to person, place, and time.     Motor: No weakness.     Gait: Gait normal.  Psychiatric:        Mood and Affect: Mood normal.        Behavior: Behavior normal.        Thought Content: Thought content normal.        Judgment: Judgment normal.   Last CBC Lab Results  Component Value Date   WBC 5.3 06/30/2022   HGB 12.0 06/30/2022   HCT  36.2 06/30/2022   MCV 93  06/30/2022   MCH 30.7 06/30/2022   RDW 13.0 06/30/2022   PLT 401 06/30/2022   Last metabolic panel Lab Results  Component Value Date   GLUCOSE 82 06/30/2022   NA 141 06/30/2022   K 4.4 06/30/2022   CL 107 (H) 06/30/2022   CO2 20 06/30/2022   BUN 9 06/30/2022   CREATININE 0.87 06/30/2022   EGFR 77 06/30/2022   CALCIUM 9.2 06/30/2022   PROT 7.1 06/30/2022   ALBUMIN 3.8 06/30/2022   LABGLOB 3.3 06/30/2022   AGRATIO 1.2 06/30/2022   BILITOT 0.2 06/30/2022   ALKPHOS 363 (H) 06/30/2022   AST 146 (H) 06/30/2022   ALT 200 (H) 06/30/2022   ANIONGAP 4 (L) 07/06/2016   Last lipids Lab Results  Component Value Date   CHOL 162 06/30/2022   HDL 52 06/30/2022   LDLCALC 98 06/30/2022   TRIG 59 06/30/2022   CHOLHDL 3.1 06/30/2022   Last hemoglobin A1c Lab Results  Component Value Date   HGBA1C 5.8 (H) 06/30/2022   Last thyroid functions Lab Results  Component Value Date   TSH 1.460 06/30/2022   Last vitamin D Lab Results  Component Value Date   VD25OH 23.9 (L) 06/30/2022   Last vitamin B12 and Folate Lab Results  Component Value Date   VITAMINB12 749 06/30/2022   FOLATE 3.4 06/30/2022        Assessment & Plan:    Routine Health Maintenance and Physical Exam  Immunization History  Administered Date(s) Administered   PPD Test 05/14/2013   Tdap 03/14/2013, 12/24/2018    Health Maintenance  Topic Date Due   Zoster Vaccines- Shingrix (1 of 2) Never done   INFLUENZA VACCINE  09/07/2022   MAMMOGRAM  11/22/2023   Fecal DNA (Cologuard)  10/25/2024   PAP SMEAR-Modifier  05/04/2025   DTaP/Tdap/Td (3 - Td or Tdap) 12/23/2028   Hepatitis C Screening  Completed   HIV Screening  Completed   HPV VACCINES  Aged Out   Colonoscopy  Discontinued   COVID-19 Vaccine  Discontinued    Discussed health benefits of physical activity, and encouraged her to engage in regular exercise appropriate for her age and condition.  Problem List Items  Addressed This Visit       Benign hypertension    She is currently prescribed amlodipine 5 mg daily.  BP has previously been well-controlled on this regimen.  BP mildly elevated today, likely secondary to dental pain in the setting of a recent dental procedure. -No medication changes today      Vitamin D insufficiency    Noted on recent labs.  Daily vitamin D supplementation recommended.      Hyperlipidemia    Lipid panel updated last month.  Total cholesterol 162 and LDL 98.  Both significantly improved following initiation of atorvastatin in March.  Her 10-year ASCVD risk today is 14%. -No additional medication changes today.  Lifestyle modifications aimed at lowering her cholesterol were reviewed.      Elevated transaminase level    CMP from last month shows elevated alkaline phosphatase (363), AST/ALT elevations as well.  No hyperbilirubinemia. -Repeat CMP today and add GGT.  Further workup pending results.      Prediabetes    A1c 5.8 on labs from last month.  Lifestyle modifications aimed at improving her blood sugar were reviewed.  She is motivated to lose weight, which I anticipate will improve her A1c as well.      Encounter for well adult exam with abnormal findings  Presenting today for her annual exam.   -Recent labs from 5/24 were reviewed.  Repeat CMP and GGT ordered as otherwise documented. -Preventative care items are largely up-to-date.  Outstanding zoster vaccination recommended -We will tentatively plan for follow-up in 3 months      Return in about 3 months (around 10/13/2022).  [name], MD

## 2022-07-13 NOTE — Assessment & Plan Note (Signed)
Lipid panel updated last month.  Total cholesterol 162 and LDL 98.  Both significantly improved following initiation of atorvastatin in March.  Her 10-year ASCVD risk today is 14%. -No additional medication changes today.  Lifestyle modifications aimed at lowering her cholesterol were reviewed.

## 2022-07-13 NOTE — Patient Instructions (Signed)
Visit Information  Thank you for taking time to visit with me today. Please don't hesitate to contact me if I can be of assistance to you.   Following are the goals we discussed today:   Goals Addressed               This Visit's Progress     Patient Stated     COMPLETED: Find Publishing rights manager for counseling, weight loss ([facility]) (pt-stated)   On track     Care Coordination Interventions:  Goal completed       COMPLETED: manage pain of both hands & both Lower extremities ([facility]) (pt-stated)        Care Coordination Interventions:   encouragement provided Confirmed disability still pending      COMPLETED: New dental provider [facility]) (pt-stated)   On track     Care Coordination Interventions: Pending [facility] dentist (Dr Delford Field ) scheduled appointment on Wednesday April 11 2022 at 2:30 pm & oral surgery visit on 06/30/22         Our next appointment is by telephone on 03/21/22 at 1100  Please call the care guide team at [phone] if you need to cancel or reschedule your appointment.   If you are experiencing a Mental Health or Behavioral Health Crisis or need someone to talk to, please call the Suicide and Crisis Lifeline: 988 call the [place] National Suicide Prevention Lifeline: 631-245-0353 or TTY: 2608519032 TTY 602-414-1196) to talk to a trained counselor call 1-800-273-TALK (toll free, 24 hour hotline) call the [facility]: (414) 470-8739 call 911   Patient verbalizes understanding of instructions and care plan provided today and agrees to view in MyChart. Active MyChart status and patient understanding of how to access instructions and care plan via MyChart confirmed with patient.     The patient has been provided with contact information for the care management team and has been advised to call with any health related questions or concerns.   Shelitha Magley L. Noelle Penner, RN, BSN, CCM [facility] Care Management Community Coordinator Office number [phone]

## 2022-07-13 NOTE — Patient Instructions (Addendum)
Visit Information  Thank you for taking time to visit with me today. Please don't hesitate to contact me if I can be of assistance to you.    [facility] at this time does not have a program that can assist with [facility] resources discussed  Outreaches were made to [facility] and [facility] SW & care guide staff were consulted  The following are some recommendations/resources  PLEASE GO TO THIS SITE FOR [facility] FINANCIAL ASSISTANCE [email or web address]  [email or web address].[email or web address]  The student may be able to get some books online at [employer].  Even rented some books.  Maybe ask the counselor about work study.  Outreach was made to your pcp There is not a Games developer in the office but you may be receiving a call from the office nurse   Holland Falling,  With reviewing your chart, It was noticed that You are not assigned to me. You will be connected with the [facility] managed medicaid program.    Following are the goals we discussed today:   Goals Addressed               This Visit's Progress     Patient Stated     Find community resource for counseling, weight loss ([facility]) (pt-stated)   On track     Care Coordination Interventions: Discussed plans with patient for ongoing care management follow up and provided patient with direct contact information for care management team Encouragement provided,  Interventions Today    Flowsheet Row Most Recent Value  Chronic Disease   Chronic disease during today's visit Other  [dental and disability/job]  General Interventions   General Interventions Discussed/Reviewed General Interventions Reviewed, [employer], dental services]  Doctor Visits Discussed/Reviewed Doctor Visits Reviewed, Specialist  PCP/Specialist Visits Compliance with follow-up visit  [pending dental services]          manage pain of both hands & both Lower extremities  ([facility]N) (pt-stated)   On track     Care Coordination Interventions:   encouragement provided Confirmed disability still pending      New dental provider [facility]) (pt-stated)   On track     Care Coordination Interventions: Pending [facility] dentist (Dr Delford Field ) scheduled appointment on Wednesday April 11 2022 at 2:30 pm & oral surgery visit on 06/30/22         Our next appointment is by telephone N/A  on N/A at N/A  Please call the care guide team at [phone] if you need to cancel or reschedule your appointment.   If you are experiencing a Mental Health or Behavioral Health Crisis or need someone to talk to, please call the Suicide and Crisis Lifeline: 988 call the [place] National Suicide Prevention Lifeline: (305)798-0581 or TTY: 647-528-1070 TTY (717)390-1707) to talk to a trained counselor call 1-800-273-TALK (toll free, 24 hour hotline) call the [facility]: [phone] call 911   Patient verbalizes understanding of instructions and care plan provided today and agrees to view in MyChart. Active MyChart status and patient understanding of how to access instructions and care plan via MyChart confirmed with patient.     The patient has been provided with contact information for the care management team and has been advised to call with any health related questions or concerns.   Kyen Taite L. Noelle Penner, RN, BSN, CCM [facility] Care Management Community Coordinator Office number [phone]

## 2022-07-14 ENCOUNTER — Other Ambulatory Visit: Payer: Self-pay | Admitting: Internal Medicine

## 2022-07-14 ENCOUNTER — Other Ambulatory Visit: Payer: Self-pay

## 2022-07-14 ENCOUNTER — Telehealth: Payer: Self-pay | Admitting: Internal Medicine

## 2022-07-14 DIAGNOSIS — R748 Abnormal levels of other serum enzymes: Secondary | ICD-10-CM

## 2022-07-14 DIAGNOSIS — R7401 Elevation of levels of liver transaminase levels: Secondary | ICD-10-CM

## 2022-07-14 LAB — CMP14+EGFR
ALT: 118 IU/L — ABNORMAL HIGH (ref 0–32)
AST: 71 IU/L — ABNORMAL HIGH (ref 0–40)
Albumin/Globulin Ratio: 1.4 (ref 1.2–2.2)
Albumin: 4.2 g/dL (ref 3.8–4.9)
Alkaline Phosphatase: 178 IU/L — ABNORMAL HIGH (ref 44–121)
BUN/Creatinine Ratio: 10 (ref 9–23)
BUN: 9 mg/dL (ref 6–24)
Bilirubin Total: 0.3 mg/dL (ref 0.0–1.2)
CO2: 24 mmol/L (ref 20–29)
Calcium: 9.3 mg/dL (ref 8.7–10.2)
Chloride: 106 mmol/L (ref 96–106)
Creatinine, Ser: 0.92 mg/dL (ref 0.57–1.00)
Globulin, Total: 2.9 g/dL (ref 1.5–4.5)
Glucose: 85 mg/dL (ref 70–99)
Potassium: 4.1 mmol/L (ref 3.5–5.2)
Sodium: 142 mmol/L (ref 134–144)
Total Protein: 7.1 g/dL (ref 6.0–8.5)
eGFR: 72 mL/min/{1.73_m2} (ref >59)

## 2022-07-14 LAB — GAMMA GT: GGT: 189 IU/L — ABNORMAL HIGH (ref 0–60)

## 2022-07-14 NOTE — Telephone Encounter (Signed)
Patient aware of why ultrasound is ordered.

## 2022-07-14 NOTE — Telephone Encounter (Signed)
Pt is concerned about why she was ordered to have an ultrasound. She would like a call back asap.

## 2022-07-18 ENCOUNTER — Other Ambulatory Visit: Payer: Self-pay

## 2022-07-18 DIAGNOSIS — E782 Mixed hyperlipidemia: Secondary | ICD-10-CM

## 2022-07-18 DIAGNOSIS — I10 Essential (primary) hypertension: Secondary | ICD-10-CM

## 2022-07-31 ENCOUNTER — Ambulatory Visit (HOSPITAL_COMMUNITY)
Admission: RE | Admit: 2022-07-31 | Discharge: 2022-07-31 | Disposition: A | Discharge status: Home / Self Care | Payer: Medicare Other | Source: Ambulatory Visit | Attending: Internal Medicine | Admitting: Internal Medicine

## 2022-07-31 DIAGNOSIS — R748 Abnormal levels of other serum enzymes: Secondary | ICD-10-CM

## 2022-07-31 DIAGNOSIS — R7401 Elevation of levels of liver transaminase levels: Secondary | ICD-10-CM | POA: Diagnosis present

## 2022-08-01 ENCOUNTER — Telehealth: Payer: Self-pay

## 2022-08-01 NOTE — Progress Notes (Signed)
Error

## 2022-08-01 NOTE — Progress Notes (Signed)
   Care Guide Note  08/01/2022 Name: Cristina Harris MRN: [id number] DOB: 09-24-1965  Referred by: [name], MD Reason for referral : Care Coordination (Outreach to schedule with Pharm d )   Cristina Harris is a 58 y.o. year old female who is a primary care patient of Durwin Nora, Lucina Mellow, MD. Cristina Harris was referred to the pharmacist for assistance related to HLD.    Successful contact was made with the patient to discuss pharmacy services including being ready for the pharmacist to call at least 5 minutes before the scheduled appointment time, to have medication bottles and any blood sugar or blood pressure readings ready for review. The patient agreed to meet with the pharmacist via with the pharmacist via telephone visit on (date/time).  08/02/2022  Penne Lash, RMA Care Guide [facility]  [place], [place] [address] Direct Dial: [phone] [email or web address]

## 2022-08-02 ENCOUNTER — Other Ambulatory Visit: Payer: Medicaid Other | Admitting: Pharmacist

## 2022-08-02 NOTE — Progress Notes (Signed)
08/02/2022 Name: Cristina Harris MRN: [id number] DOB: May 04, 1965  Medication Assistance   Cristina Harris is a 57 y.o. year old female who presented for a telephone visit.   They were referred to the pharmacist by  [facility] care management  for assistance in managing medication access.    Subjective:  Medication Access/Adherence  Current Pharmacy:  [id number] - [place], [place] - [address] [facility] [address] [place] [address] Phone: [phone] Fax: [phone]  [id number] - [place], [place] - [address] AT [facility] [address] [place] [address] Phone: [phone] Fax: [phone]  [facility] - [place], [place] - [address] [place] [address] Phone: [phone] Fax: [phone]  Patient reports affordability concerns with their medications: Yes  Patient reports access/transportation concerns to their pharmacy: No  Patient reports adherence concerns with their medications:  Yes  due to cost  Medication Management:  Current adherence strategy: difficult due to copay amounts  Patient reports Good adherence to medications  Patient reports the following barriers to adherence: financial; can't afford $4 copays  Objective:  Lab Results  Component Value Date   HGBA1C 5.8 (H) 06/30/2022    Lab Results  Component Value Date   CREATININE 0.92 07/13/2022   BUN 9 07/13/2022   NA 142 07/13/2022   K 4.1 07/13/2022   CL 106 07/13/2022   CO2 24 07/13/2022    Lab Results  Component Value Date   CHOL 162 06/30/2022   HDL 52 06/30/2022   LDLCALC 98 06/30/2022   TRIG 59 06/30/2022   CHOLHDL 3.1 06/30/2022    Medications Reviewed Today     Reviewed by Danella Maiers, [facility] (Pharmacist) on 08/02/22 at 1401  Med List Status: <None>   Medication Order Taking? Sig Documenting Provider Last Dose Status Informant  albuterol (VENTOLIN HFA) 108 (90 Base) MCG/ACT  inhaler [id number] No Inhale 1 puff into the lungs as needed for wheezing or shortness of breath. [name], MD Taking Active   amLODipine (NORVASC) 5 MG tablet [id number] No TAKE 1 TABLET (5 MG TOTAL) BY MOUTH DAILY. [name], MD Taking Active   atorvastatin (LIPITOR) 40 MG tablet [id number] No Take 1 tablet (40 mg total) by mouth daily. [name], MD Taking Active   chlorhexidine (PERIDEX) 0.12 % solution [id number] No SMARTSIG:By Mouth [provider] Taking Active   ibuprofen (ADVIL) 800 MG tablet [id number] No Take 1 tablet (800 mg total) by mouth daily as needed (headache). [name], MD Taking Active   naproxen (NAPROSYN) 500 MG tablet [id number] No Take 500 mg by mouth 2 (two) times daily. [provider] Taking Active   phentermine 15 MG capsule [id number] No Take 1 capsule (15 mg total) by mouth every morning. [name], MD Taking Expired 07/30/22 2359   tiZANidine (ZANAFLEX) 2 MG tablet [id number] No Take by mouth. [provider] Taking Active               Assessment/Plan:   Medication Management: - Reviewed medications with patient -Having difficulty affording statin (watch LFTs, inhaler, tizanidine, ibuprofen) -- awaiting disability -Since patient has medicaid, I'm unaware of any resources to assist with medication cost -Will discuss with pharmacy team to identify any resources that may be available.    Follow Up Plan: 1 week  Kieth Brightly, PharmD, BCACP Clinical Pharmacist, [facility]

## 2022-09-07 ENCOUNTER — Other Ambulatory Visit: Payer: Self-pay

## 2022-09-08 ENCOUNTER — Other Ambulatory Visit: Payer: Self-pay

## 2022-09-13 ENCOUNTER — Other Ambulatory Visit (HOSPITAL_COMMUNITY): Payer: Self-pay

## 2022-09-20 ENCOUNTER — Other Ambulatory Visit: Payer: Medicaid Other | Admitting: Pharmacist

## 2022-10-10 ENCOUNTER — Encounter: Payer: Self-pay | Admitting: Internal Medicine

## 2022-10-10 ENCOUNTER — Ambulatory Visit (INDEPENDENT_AMBULATORY_CARE_PROVIDER_SITE_OTHER): Payer: Medicare Other | Admitting: Internal Medicine

## 2022-10-10 VITALS — BP 125/85 | HR 83 | Ht 64.0 in | Wt 210.6 lb

## 2022-10-10 DIAGNOSIS — Z2821 Immunization not carried out because of patient refusal: Secondary | ICD-10-CM | POA: Diagnosis not present

## 2022-10-10 DIAGNOSIS — I10 Essential (primary) hypertension: Secondary | ICD-10-CM

## 2022-10-10 DIAGNOSIS — R7303 Prediabetes: Secondary | ICD-10-CM | POA: Diagnosis not present

## 2022-10-10 DIAGNOSIS — E559 Vitamin D deficiency, unspecified: Secondary | ICD-10-CM

## 2022-10-10 DIAGNOSIS — E782 Mixed hyperlipidemia: Secondary | ICD-10-CM

## 2022-10-10 DIAGNOSIS — E669 Obesity, unspecified: Secondary | ICD-10-CM

## 2022-10-10 MED ORDER — IBUPROFEN 800 MG PO TABS: 800.0000 mg | ORAL_TABLET | Freq: Every day | ORAL | 1 refills | Status: DC | PRN

## 2022-10-10 MED ORDER — ATORVASTATIN CALCIUM 40 MG PO TABS: 40.0000 mg | ORAL_TABLET | Freq: Every day | ORAL | 3 refills | Status: DC

## 2022-10-10 MED ORDER — AMLODIPINE BESYLATE 5 MG PO TABS
5.0000 mg | ORAL_TABLET | Freq: Every day | ORAL | 3 refills | Status: DC
Start: 2022-10-10 — End: 2023-11-05

## 2022-10-10 NOTE — Progress Notes (Signed)
Established Patient Office Visit  Subjective   Patient ID: Cristina Harris, female    DOB: 11/07/1965  Age: 57 y.o. MRN: [id number]  Chief Complaint  Patient presents with   Hyperlipidemia    Follow up   Ms. Cristina Harris returns to care today for routine follow-up.  She was last evaluated by me on 6/6 for her annual exam.  There have been no acute interval events.  Ms. Cristina Harris reports feeling fairly well today.  She endorses chronic lumbar back pain and insomnia.  She has no acute concerns to discuss today, but remains concerned about her weight.   Past Medical History:  Diagnosis Date   Carpal tunnel syndrome on both sides    Past Surgical History:  Procedure Laterality Date   CARPAL TUNNEL RELEASE Right 2022   COLONOSCOPY WITH PROPOFOL N/A 02/14/2022   Procedure: COLONOSCOPY WITH PROPOFOL;  Surgeon: Lanelle Bal, DO;  Location: [facility];  Service: Endoscopy;  Laterality: N/A;  1:30 pm, pt knows to arrive at 7:00   NASAL FRACTURE SURGERY     POLYPECTOMY  02/14/2022   Procedure: POLYPECTOMY;  Surgeon: Lanelle Bal, DO;  Location: [facility];  Service: Endoscopy;;   TUBAL LIGATION     WRIST ARTHROSCOPY Right 2022   Social History   Tobacco Use   Smoking status: Former    Current packs/day: 0.00    Types: Cigarettes    Quit date: 04/2021    Years since quitting: 1.5    Passive exposure: Past   Smokeless tobacco: Never   Tobacco comments:    Started smoking at age 39, quit smoking 2023.   Vaping Use   Vaping status: Former  Substance Use Topics   Alcohol use: Yes    Comment: occ   Drug use: No   Family History  Problem Relation Age of Onset   Breast cancer Mother    Heart failure Father    Diabetes type II Father    Heart failure Brother    Lupus Daughter    Diabetes type II Maternal Grandmother    Colon cancer Neg Hx    Cervical cancer Neg Hx    No Known Allergies  Review of Systems  Musculoskeletal:  Positive for back pain (chronic lumbar back  pain).  Psychiatric/Behavioral:  The patient has insomnia.      Objective:     BP 125/85   Pulse 83   Ht 5\' 4"  (1.626 m)   Wt 210 lb 9.6 oz (95.5 kg)   LMP 03/06/2013   SpO2 95%   BMI 36.15 kg/m  BP Readings from Last 3 Encounters:  10/10/22 125/85  07/13/22 (!) 140/82  06/30/22 132/88   Physical Exam Vitals reviewed.  Constitutional:      General: She is not in acute distress.    Appearance: Normal appearance. She is obese. She is not toxic-appearing.  HENT:     Head: Normocephalic and atraumatic.     Nose: Nose normal. No congestion or rhinorrhea.     Mouth/Throat:     Mouth: Mucous membranes are moist.     Pharynx: Oropharynx is clear. No oropharyngeal exudate or posterior oropharyngeal erythema.  Eyes:     General: No scleral icterus.    Extraocular Movements: Extraocular movements intact.     Conjunctiva/sclera: Conjunctivae normal.     Pupils: Pupils are equal, round, and reactive to light.  Cardiovascular:     Rate and Rhythm: Normal rate and regular rhythm.  Pulses: Normal pulses.     Heart sounds: Normal heart sounds. No murmur heard.    No friction rub. No gallop.  Pulmonary:     Effort: Pulmonary effort is normal. No respiratory distress.     Breath sounds: Normal breath sounds. No wheezing, rhonchi or rales.  Abdominal:     General: Abdomen is flat. Bowel sounds are normal. There is no distension.     Palpations: Abdomen is soft.     Tenderness: There is no abdominal tenderness.  Musculoskeletal:        General: No swelling or deformity.     Cervical back: Normal range of motion.     Right lower leg: No edema.     Left lower leg: No edema.  Skin:    General: Skin is warm and dry.     Capillary Refill: Capillary refill takes less than 2 seconds.     Coloration: Skin is not jaundiced.  Neurological:     General: No focal deficit present.     Mental Status: She is alert and oriented to person, place, and time.     Motor: No weakness.     Gait:  Gait normal.  Psychiatric:        Mood and Affect: Mood normal.        Behavior: Behavior normal.        Thought Content: Thought content normal.        Judgment: Judgment normal.   Last CBC Lab Results  Component Value Date   WBC 5.3 06/30/2022   HGB 12.0 06/30/2022   HCT 36.2 06/30/2022   MCV 93 06/30/2022   MCH 30.7 06/30/2022   RDW 13.0 06/30/2022   PLT 401 06/30/2022   Last metabolic panel Lab Results  Component Value Date   GLUCOSE 85 07/13/2022   NA 142 07/13/2022   K 4.1 07/13/2022   CL 106 07/13/2022   CO2 24 07/13/2022   BUN 9 07/13/2022   CREATININE 0.92 07/13/2022   EGFR 72 07/13/2022   CALCIUM 9.3 07/13/2022   PROT 7.1 07/13/2022   ALBUMIN 4.2 07/13/2022   LABGLOB 2.9 07/13/2022   AGRATIO 1.4 07/13/2022   BILITOT 0.3 07/13/2022   ALKPHOS 178 (H) 07/13/2022   AST 71 (H) 07/13/2022   ALT 118 (H) 07/13/2022   ANIONGAP 4 (L) 07/06/2016   Last lipids Lab Results  Component Value Date   CHOL 162 06/30/2022   HDL 52 06/30/2022   LDLCALC 98 06/30/2022   TRIG 59 06/30/2022   CHOLHDL 3.1 06/30/2022   Last hemoglobin A1c Lab Results  Component Value Date   HGBA1C 5.8 (H) 06/30/2022   Last thyroid functions Lab Results  Component Value Date   TSH 1.460 06/30/2022   Last vitamin D Lab Results  Component Value Date   VD25OH 23.9 (L) 06/30/2022   Last vitamin B12 and Folate Lab Results  Component Value Date   VITAMINB12 749 06/30/2022   FOLATE 3.4 06/30/2022   The 10-year ASCVD risk score (Arnett DK, et al., 2019) is: 9.3%    Assessment & Plan:   Problem List Items Addressed This Visit       Benign hypertension    Remains adequately controlled on current antihypertensive regimen.  No medication changes are indicated today. -Amlodipine has been refilled      Obesity (BMI 30-39.9)    BMI 36.1.  Current weight is 210 pounds.  She has previously been prescribed phentermine, last filled in June.  She remains concerned about  her weight and  is interested in additional weight loss.  Her exercise capacity is limited due to chronic musculoskeletal pain.  She has focused on dietary changes aimed at weight loss. -Through shared decision making, we will plan to start [facility].  -Lifestyle changes aimed at weight loss were reinforced again today -We will tentatively plan for follow-up in 3 months for weight management      Vitamin D insufficiency    Noted on previous labs.  Daily vitamin D supplementation recommended.      Return in about 3 months (around 01/09/2023).   [name], MD

## 2022-10-10 NOTE — Assessment & Plan Note (Signed)
Remains adequately controlled on current antihypertensive regimen.  No medication changes are indicated today. -Amlodipine has been refilled

## 2022-10-10 NOTE — Assessment & Plan Note (Signed)
Noted on previous labs.  Daily vitamin D supplementation recommended.

## 2022-10-10 NOTE — Patient Instructions (Addendum)
It was a pleasure to see you today.  Thank you for giving [place] the opportunity to be involved in your care.  Below is a brief recap of your visit and next steps.  We will plan to see you again in 3 months.  Summary We will plan to start [facility] for weight loss No additional medication changes Follow up in 3 months    Schedule your Medicare Annual Wellness Visit at checkout.

## 2022-10-10 NOTE — Assessment & Plan Note (Addendum)
BMI 36.1.  Current weight is 210 pounds.  She has previously been prescribed phentermine, last filled in June.  She remains concerned about her weight and is interested in additional weight loss.  Her exercise capacity is limited due to chronic musculoskeletal pain.  She has focused on dietary changes aimed at weight loss. -Through shared decision making, Cristina Harris has been prescribed today. -We will tentatively plan for follow-up in 3 months for weight management

## 2022-10-11 ENCOUNTER — Other Ambulatory Visit: Payer: Self-pay

## 2022-10-17 ENCOUNTER — Telehealth: Payer: Self-pay | Admitting: Internal Medicine

## 2022-10-17 DIAGNOSIS — E669 Obesity, unspecified: Secondary | ICD-10-CM

## 2022-10-17 NOTE — Telephone Encounter (Signed)
Spoke with patient in regards to [facility]. Advised patient that we can try and run it and see what the determination is.

## 2022-10-17 NOTE — Telephone Encounter (Signed)
Patient called asked if Dr Durwin Nora nurse could return her call has questions on her weight loss medicine. Call back # [phone].

## 2022-10-25 ENCOUNTER — Other Ambulatory Visit: Payer: Self-pay | Admitting: Internal Medicine

## 2022-10-25 DIAGNOSIS — Z1231 Encounter for screening mammogram for malignant neoplasm of breast: Secondary | ICD-10-CM

## 2022-10-25 NOTE — Telephone Encounter (Signed)
Patient came by office in regard to previous tele messages.  Has not heard anything back in regard to determination of [employer] coverage.  Patient now has full medicare along with her medicaid.  Is awaiting response from office

## 2022-10-26 MED ORDER — SEMAGLUTIDE-WEIGHT MANAGEMENT 0.25 MG/0.5ML ~~LOC~~ SOAJ
0.2500 mg | SUBCUTANEOUS | 0 refills | Status: DC
Start: 2022-10-26 — End: 2022-11-10

## 2022-10-26 NOTE — Telephone Encounter (Signed)
Awaiting PA to come in from the pharmacy

## 2022-10-26 NOTE — Addendum Note (Signed)
Addended by: Christel Mormon E on: 10/26/2022 07:50 AM   Modules accepted: Orders

## 2022-11-06 NOTE — Telephone Encounter (Signed)
Patient returning call about this medication. [phone].  [name] was connected with her her insurance. Patient has both Company secretary.

## 2022-11-07 NOTE — Telephone Encounter (Signed)
PA submitted on cover my meds. Waiting on decision

## 2022-11-08 ENCOUNTER — Ambulatory Visit: Payer: Medicare Other | Admitting: Internal Medicine

## 2022-11-08 NOTE — Telephone Encounter (Signed)
Since there is confusion on which of her insurances cover her rx's, please have her call the pharmacy and ask them to start the [facility] authorization with covermymeds. We will complete it from there. Thanks

## 2022-11-08 NOTE — Telephone Encounter (Signed)
Spoke with patient needs to have note with reasoning as to why provider is prescribing medication for patient.  Pt wants a call back.

## 2022-11-09 ENCOUNTER — Encounter: Payer: Self-pay | Admitting: Internal Medicine

## 2022-11-09 ENCOUNTER — Other Ambulatory Visit: Payer: Self-pay | Admitting: Internal Medicine

## 2022-11-09 ENCOUNTER — Telehealth: Payer: Self-pay | Admitting: Internal Medicine

## 2022-11-09 DIAGNOSIS — E669 Obesity, unspecified: Secondary | ICD-10-CM

## 2022-11-09 NOTE — Telephone Encounter (Signed)
Verlon Au w. [facility] called in on patient behalf   States that patient called in stating that prior Berkley Harvey was approved for Faylene Kurtz needs confirmation   [phone]

## 2022-11-09 NOTE — Telephone Encounter (Signed)
Spoke to patient will contact her pharmacy

## 2022-11-09 NOTE — Telephone Encounter (Signed)
Please contact pharmacy and let them know that we do not even know what insurance to run the PA through and see if they will start the PA on covermymeds. thanks

## 2022-11-10 MED ORDER — SEMAGLUTIDE-WEIGHT MANAGEMENT 0.25 MG/0.5ML ~~LOC~~ SOAJ
0.2500 mg | SUBCUTANEOUS | 0 refills | Status: AC
Start: 2022-11-10 — End: 2022-12-08

## 2022-11-10 NOTE — Telephone Encounter (Signed)
Pt insurance information has been updated and verified and in system   Coverage is [address]

## 2022-11-15 DIAGNOSIS — J45909 Unspecified asthma, uncomplicated: Secondary | ICD-10-CM | POA: Diagnosis not present

## 2022-11-15 DIAGNOSIS — Z008 Encounter for other general examination: Secondary | ICD-10-CM | POA: Diagnosis not present

## 2022-11-15 DIAGNOSIS — E785 Hyperlipidemia, unspecified: Secondary | ICD-10-CM | POA: Diagnosis not present

## 2022-11-15 DIAGNOSIS — M4800 Spinal stenosis, site unspecified: Secondary | ICD-10-CM | POA: Diagnosis not present

## 2022-11-15 DIAGNOSIS — Z973 Presence of spectacles and contact lenses: Secondary | ICD-10-CM | POA: Diagnosis not present

## 2022-11-15 DIAGNOSIS — Z720 Tobacco use: Secondary | ICD-10-CM | POA: Diagnosis not present

## 2022-11-15 DIAGNOSIS — Z6838 Body mass index (BMI) 38.0-38.9, adult: Secondary | ICD-10-CM | POA: Diagnosis not present

## 2022-11-15 DIAGNOSIS — M1990 Unspecified osteoarthritis, unspecified site: Secondary | ICD-10-CM | POA: Diagnosis not present

## 2022-11-15 DIAGNOSIS — I10 Essential (primary) hypertension: Secondary | ICD-10-CM | POA: Diagnosis not present

## 2022-11-21 DIAGNOSIS — M5442 Lumbago with sciatica, left side: Secondary | ICD-10-CM | POA: Diagnosis not present

## 2022-11-27 ENCOUNTER — Ambulatory Visit
Admission: RE | Admit: 2022-11-27 | Discharge: 2022-11-27 | Disposition: A | Discharge status: Home / Self Care | Payer: Medicare Other | Source: Ambulatory Visit | Attending: Internal Medicine | Admitting: Internal Medicine

## 2022-11-27 DIAGNOSIS — Z1231 Encounter for screening mammogram for malignant neoplasm of breast: Secondary | ICD-10-CM | POA: Diagnosis not present

## 2022-12-05 ENCOUNTER — Telehealth: Payer: Medicare HMO | Admitting: Physician Assistant

## 2022-12-05 DIAGNOSIS — J019 Acute sinusitis, unspecified: Secondary | ICD-10-CM

## 2022-12-05 DIAGNOSIS — B9689 Other specified bacterial agents as the cause of diseases classified elsewhere: Secondary | ICD-10-CM

## 2022-12-05 MED ORDER — BENZONATATE 100 MG PO CAPS
100.0000 mg | ORAL_CAPSULE | Freq: Three times a day (TID) | ORAL | 0 refills | Status: DC | PRN
Start: 2022-12-05 — End: 2023-01-09

## 2022-12-05 MED ORDER — AMOXICILLIN-POT CLAVULANATE 875-125 MG PO TABS
1.0000 | ORAL_TABLET | Freq: Two times a day (BID) | ORAL | 0 refills | Status: DC
Start: 2022-12-05 — End: 2023-01-09

## 2022-12-05 NOTE — Progress Notes (Signed)
Virtual Visit Consent   Cristina Harris, you are scheduled for a virtual visit with a [facility] provider today. Just as with appointments in the office, your consent must be obtained to participate. Your consent will be active for this visit and any virtual visit you may have with one of our providers in the next 365 days. If you have a MyChart account, a copy of this consent can be sent to you electronically.  As this is a virtual visit, video technology does not allow for your provider to perform a traditional examination. This may limit your provider's ability to fully assess your condition. If your provider identifies any concerns that need to be evaluated in person or the need to arrange testing (such as labs, EKG, etc.), we will make arrangements to do so. Although advances in technology are sophisticated, we cannot ensure that it will always work on either your end or our end. If the connection with a video visit is poor, the visit may have to be switched to a telephone visit. With either a video or telephone visit, we are not always able to ensure that we have a secure connection.  By engaging in this virtual visit, you consent to the provision of healthcare and authorize for your insurance to be billed (if applicable) for the services provided during this visit. Depending on your insurance coverage, you may receive a charge related to this service.  I need to obtain your verbal consent now. Are you willing to proceed with your visit today? [name] has provided verbal consent on 12/05/2022 for a virtual visit (video or telephone). Margaretann Loveless, PA-C  Date: 12/05/2022 10:32 AM  Virtual Visit via Video Note   I, Margaretann Loveless, connected with  Cristina Harris  ([id number], Jun 15, 1965) on 12/05/22 at 10:30 AM EDT by a video-enabled telemedicine application and verified that I am speaking with the correct person using two identifiers.  Location: Patient: Virtual Visit  Location Patient: Home Provider: Virtual Visit Location Provider: Home Office   I discussed the limitations of evaluation and management by telemedicine and the availability of in person appointments. The patient expressed understanding and agreed to proceed.    History of Present Illness: Cristina Harris is a 57 y.o. who identifies as a female who was assigned female at birth, and is being seen today for sinus congestion and cough.  HPI: Sinusitis This is a new problem. The current episode started in the past 7 days. The problem has been gradually worsening since onset. There has been no fever. Associated symptoms include congestion, coughing (dry now), headaches, sinus pressure and a sore throat (from drainage). Pertinent negatives include no chills, diaphoresis, ear pain, hoarse voice or shortness of breath. (Rhinorrhea and post nasal drainage, body aches) Treatments tried: theraflu, mucinex, ibuprofen. The treatment provided no relief.     Problems:  Patient Active Problem List   Diagnosis Date Noted   Elevated transaminase level 07/13/2022   Prediabetes 07/13/2022   Encounter for well adult exam with abnormal findings 07/13/2022   Encounter for Papanicolaou smear of cervix 05/05/2022   Other spondylosis with radiculopathy, lumbar region 05/02/2022   Lower extremity numbness 04/18/2022   Positive colorectal cancer screening using Cologuard test 11/28/2021   Hyperlipidemia 11/28/2021   Problem related to unspecified psychosocial circumstances 11/07/2021   Tobacco use 10/17/2021   Acute bronchitis 10/06/2021   Acute pansinusitis 10/06/2021   Amenorrhea 10/06/2021   Vitamin D insufficiency 08/02/2021   Preventative health care 07/08/2021  Obesity (BMI 30-39.9) 07/08/2021   Asthma 07/08/2021   Depression, major, single episode, moderate (HCC) 07/08/2021   Benign hypertension 05/25/2020   Bilateral carpal tunnel syndrome 04/21/2020   Degenerative tear of triangular fibrocartilage  complex (TFCC) of right wrist 09/26/2019   Pain, wrist, right 09/26/2019    Allergies: No Known Allergies Medications:  Current Outpatient Medications:    albuterol (VENTOLIN HFA) 108 (90 Base) MCG/ACT inhaler, Inhale 1 puff into the lungs as needed for wheezing or shortness of breath., Disp: 18 each, Rfl: 1   amLODipine (NORVASC) 5 MG tablet, Take 1 tablet (5 mg total) by mouth daily., Disp: 90 tablet, Rfl: 3   amoxicillin-clavulanate (AUGMENTIN) 875-125 MG tablet, Take 1 tablet by mouth 2 (two) times daily., Disp: 14 tablet, Rfl: 0   atorvastatin (LIPITOR) 40 MG tablet, Take 1 tablet (40 mg total) by mouth daily., Disp: 90 tablet, Rfl: 3   benzonatate (TESSALON) 100 MG capsule, Take 1-2 capsules (100-200 mg total) by mouth 3 (three) times daily as needed., Disp: 30 capsule, Rfl: 0   ibuprofen (ADVIL) 800 MG tablet, Take 1 tablet (800 mg total) by mouth daily as needed (headache)., Disp: 30 tablet, Rfl: 1   Semaglutide-Weight Management 0.25 MG/0.5ML SOAJ, Inject 0.25 mg into the skin once a week for 28 days., Disp: 2 mL, Rfl: 0  Observations/Objective: Patient is well-developed, well-nourished in no acute distress.  Resting comfortably at home.  Head is normocephalic, atraumatic.  No labored breathing.  Speech is clear and coherent with logical content.  Patient is alert and oriented at baseline.    Assessment and Plan: 1. Acute bacterial sinusitis - amoxicillin-clavulanate (AUGMENTIN) 875-125 MG tablet; Take 1 tablet by mouth 2 (two) times daily.  Dispense: 14 tablet; Refill: 0 - benzonatate (TESSALON) 100 MG capsule; Take 1-2 capsules (100-200 mg total) by mouth 3 (three) times daily as needed.  Dispense: 30 capsule; Refill: 0  - Worsening symptoms that have not responded to OTC medications.  - Will give Augmentin and Tessalon perles - Continue allergy medications.  - Steam and humidifier can help - Stay well hydrated and get plenty of rest.  - Seek in person evaluation if no  symptom improvement or if symptoms worsen   Follow Up Instructions: I discussed the assessment and treatment plan with the patient. The patient was provided an opportunity to ask questions and all were answered. The patient agreed with the plan and demonstrated an understanding of the instructions.  A copy of instructions were sent to the patient via MyChart unless otherwise noted below.    The patient was advised to call back or seek an in-person evaluation if the symptoms worsen or if the condition fails to improve as anticipated.    Margaretann Loveless, PA-C

## 2022-12-05 NOTE — Patient Instructions (Signed)
[name], thank you for joining Margaretann Loveless, PA-C for today's virtual visit.  While this provider is not your primary care provider (PCP), if your PCP is located in our provider database this encounter information will be shared with them immediately following your visit.   A [facility] MyChart account gives you access to today's visit and all your visits, tests, and labs performed at [facility] " click here if you don't have a [facility] MyChart account or go to [email or web address]  Consent: (Patient) [name] provided verbal consent for this virtual visit at the beginning of the encounter.  Current Medications:  Current Outpatient Medications:    amoxicillin-clavulanate (AUGMENTIN) 875-125 MG tablet, Take 1 tablet by mouth 2 (two) times daily., Disp: 14 tablet, Rfl: 0   benzonatate (TESSALON) 100 MG capsule, Take 1-2 capsules (100-200 mg total) by mouth 3 (three) times daily as needed., Disp: 30 capsule, Rfl: 0   albuterol (VENTOLIN HFA) 108 (90 Base) MCG/ACT inhaler, Inhale 1 puff into the lungs as needed for wheezing or shortness of breath., Disp: 18 each, Rfl: 1   amLODipine (NORVASC) 5 MG tablet, Take 1 tablet (5 mg total) by mouth daily., Disp: 90 tablet, Rfl: 3   atorvastatin (LIPITOR) 40 MG tablet, Take 1 tablet (40 mg total) by mouth daily., Disp: 90 tablet, Rfl: 3   ibuprofen (ADVIL) 800 MG tablet, Take 1 tablet (800 mg total) by mouth daily as needed (headache)., Disp: 30 tablet, Rfl: 1   Semaglutide-Weight Management 0.25 MG/0.5ML SOAJ, Inject 0.25 mg into the skin once a week for 28 days., Disp: 2 mL, Rfl: 0   Medications ordered in this encounter:  Meds ordered this encounter  Medications   amoxicillin-clavulanate (AUGMENTIN) 875-125 MG tablet    Sig: Take 1 tablet by mouth 2 (two) times daily.    Dispense:  14 tablet    Refill:  0    Order Specific Question:   Supervising Provider    Answer:   Merrilee Jansky [[id number]]    benzonatate (TESSALON) 100 MG capsule    Sig: Take 1-2 capsules (100-200 mg total) by mouth 3 (three) times daily as needed.    Dispense:  30 capsule    Refill:  0    Order Specific Question:   Supervising Provider    Answer:   Merrilee Jansky [id number]     *If you need refills on other medications prior to your next appointment, please contact your pharmacy*  Follow-Up: Call back or seek an in-person evaluation if the symptoms worsen or if the condition fails to improve as anticipated.  [facility] [phone]  Other Instructions  Sinus Infection, Adult A sinus infection, also called sinusitis, is inflammation of your sinuses. Sinuses are hollow spaces in the bones around your face. Your sinuses are located: Around your eyes. In the middle of your forehead. Behind your nose. In your cheekbones. Mucus normally drains out of your sinuses. When your nasal tissues become inflamed or swollen, mucus can become trapped or blocked. This allows bacteria, viruses, and fungi to grow, which leads to infection. Most infections of the sinuses are caused by a virus. A sinus infection can develop quickly. It can last for up to 4 weeks (acute) or for more than 12 weeks (chronic). A sinus infection often develops after a cold. What are the causes? This condition is caused by anything that creates swelling in the sinuses or stops mucus from draining. This includes: Allergies. Asthma. Infection from  bacteria or viruses. Deformities or blockages in your nose or sinuses. Abnormal growths in the nose (nasal polyps). Pollutants, such as chemicals or irritants in the air. Infection from fungi. This is rare. What increases the risk? You are more likely to develop this condition if you: Have a weak body defense system (immune system). Do a lot of swimming or diving. Overuse nasal sprays. Smoke. What are the signs or symptoms? The main symptoms of this condition are pain and a feeling  of pressure around the affected sinuses. Other symptoms include: Stuffy nose or congestion that makes it difficult to breathe through your nose. Thick yellow or greenish drainage from your nose. Tenderness, swelling, and warmth over the affected sinuses. A cough that may get worse at night. Decreased sense of smell and taste. Extra mucus that collects in the throat or the back of the nose (postnasal drip) causing a sore throat or bad breath. Tiredness (fatigue). Fever. How is this diagnosed? This condition is diagnosed based on: Your symptoms. Your medical history. A physical exam. Tests to find out if your condition is acute or chronic. This may include: Checking your nose for nasal polyps. Viewing your sinuses using a device that has a light (endoscope). Testing for allergies or bacteria. Imaging tests, such as an MRI or CT scan. In rare cases, a bone biopsy may be done to rule out more serious types of fungal sinus disease. How is this treated? Treatment for a sinus infection depends on the cause and whether your condition is chronic or acute. If caused by a virus, your symptoms should go away on their own within 10 days. You may be given medicines to relieve symptoms. They include: Medicines that shrink swollen nasal passages (decongestants). A spray that eases inflammation of the nostrils (topical intranasal corticosteroids). Rinses that help get rid of thick mucus in your nose (nasal saline washes). Medicines that treat allergies (antihistamines). Over-the-counter pain relievers. If caused by bacteria, your health care provider may recommend waiting to see if your symptoms improve. Most bacterial infections will get better without antibiotic medicine. You may be given antibiotics if you have: A severe infection. A weak immune system. If caused by narrow nasal passages or nasal polyps, surgery may be needed. Follow these instructions at home: Medicines Take, use, or apply  over-the-counter and prescription medicines only as told by your health care provider. These may include nasal sprays. If you were prescribed an antibiotic medicine, take it as told by your health care provider. Do not stop taking the antibiotic even if you start to feel better. Hydrate and humidify  Drink enough fluid to keep your urine pale yellow. Staying hydrated will help to thin your mucus. Use a cool mist humidifier to keep the humidity level in your home above 50%. Inhale steam for 10-15 minutes, 3-4 times a day, or as told by your health care provider. You can do this in the bathroom while a hot shower is running. Limit your exposure to cool or dry air. Rest Rest as much as possible. Sleep with your head raised (elevated). Make sure you get enough sleep each night. General instructions  Apply a warm, moist washcloth to your face 3-4 times a day or as told by your health care provider. This will help with discomfort. Use nasal saline washes as often as told by your health care provider. Wash your hands often with soap and water to reduce your exposure to germs. If soap and water are not available, use hand sanitizer.  Do not smoke. Avoid being around people who are smoking (secondhand smoke). Keep all follow-up visits. This is important. Contact a health care provider if: You have a fever. Your symptoms get worse. Your symptoms do not improve within 10 days. Get help right away if: You have a severe headache. You have persistent vomiting. You have severe pain or swelling around your face or eyes. You have vision problems. You develop confusion. Your neck is stiff. You have trouble breathing. These symptoms may be an emergency. Get help right away. Call 911. Do not wait to see if the symptoms will go away. Do not drive yourself to the hospital. Summary A sinus infection is soreness and inflammation of your sinuses. Sinuses are hollow spaces in the bones around your  face. This condition is caused by nasal tissues that become inflamed or swollen. The swelling traps or blocks the flow of mucus. This allows bacteria, viruses, and fungi to grow, which leads to infection. If you were prescribed an antibiotic medicine, take it as told by your health care provider. Do not stop taking the antibiotic even if you start to feel better. Keep all follow-up visits. This is important. This information is not intended to replace advice given to you by your health care provider. Make sure you discuss any questions you have with your health care provider. Document Revised: 12/28/2020 Document Reviewed: 12/28/2020 Elsevier Patient Education  2024 Elsevier Inc.    If you have been instructed to have an in-person evaluation today at a local Urgent Care facility, please use the link below. It will take you to a list of all of our available [facility], including address, phone number and hours of operation. Please do not delay care.  [facility]  If you or a family member do not have a primary care provider, use the link below to schedule a visit and establish care. When you choose a [facility] primary care physician or advanced practice provider, you gain a long-term partner in health. Find a Primary Care Provider  Learn more about [facility]'s in-office and virtual care options: [facility] - Get Care Now

## 2023-01-09 ENCOUNTER — Ambulatory Visit (INDEPENDENT_AMBULATORY_CARE_PROVIDER_SITE_OTHER): Payer: Medicare HMO | Admitting: Internal Medicine

## 2023-01-09 ENCOUNTER — Encounter: Payer: Self-pay | Admitting: Internal Medicine

## 2023-01-09 VITALS — BP 126/84 | HR 89 | Ht 64.0 in | Wt 217.8 lb

## 2023-01-09 DIAGNOSIS — E669 Obesity, unspecified: Secondary | ICD-10-CM | POA: Diagnosis not present

## 2023-01-09 DIAGNOSIS — R7401 Elevation of levels of liver transaminase levels: Secondary | ICD-10-CM

## 2023-01-09 DIAGNOSIS — K760 Fatty (change of) liver, not elsewhere classified: Secondary | ICD-10-CM | POA: Diagnosis not present

## 2023-01-09 DIAGNOSIS — M5416 Radiculopathy, lumbar region: Secondary | ICD-10-CM | POA: Diagnosis not present

## 2023-01-09 DIAGNOSIS — R7303 Prediabetes: Secondary | ICD-10-CM | POA: Diagnosis not present

## 2023-01-09 MED ORDER — SEMAGLUTIDE-WEIGHT MANAGEMENT 0.25 MG/0.5ML ~~LOC~~ SOAJ
0.2500 mg | SUBCUTANEOUS | 0 refills | Status: DC
Start: 2023-01-09 — End: 2023-01-16

## 2023-01-09 NOTE — Progress Notes (Signed)
Established Patient Office Visit  Subjective   Patient ID: Cristina Harris, female    DOB: [birth year]  Age: 57 y.o. MRN: [id number]  Chief Complaint  Patient presents with   Hyperlipidemia    Three month follow up    Cristina Harris returns to care today for weight loss follow-up.  She was last evaluated by me on 9/3.  Reginal Lutes was prescribed at that time and 53-month follow-up was arranged.  Ultimately, Reginal Lutes was not approved for unclear reasons.  Cristina Harris endorses chronic back pain but is otherwise asymptomatic.  She does not have any additional concerns to discuss.  Her weight today is 217 pounds, which is up from 210 pounds in September.  She has recently joined a gym and is working with a Psychologist, educational on dietary changes.  Past Medical History:  Diagnosis Date   Carpal tunnel syndrome on both sides    Past Surgical History:  Procedure Laterality Date   CARPAL TUNNEL RELEASE Right 2022   COLONOSCOPY WITH PROPOFOL N/A 02/14/2022   Procedure: COLONOSCOPY WITH PROPOFOL;  Surgeon: Lanelle Bal, DO;  Location: [facility];  Service: Endoscopy;  Laterality: N/A;  1:30 pm, pt knows to arrive at 7:00   NASAL FRACTURE SURGERY     POLYPECTOMY  02/14/2022   Procedure: POLYPECTOMY;  Surgeon: Lanelle Bal, DO;  Location: [facility];  Service: Endoscopy;;   TUBAL LIGATION     WRIST ARTHROSCOPY Right 2022   Social History   Tobacco Use   Smoking status: Former    Current packs/day: 0.00    Types: Cigarettes    Quit date: 04/2021    Years since quitting: 1.7    Passive exposure: Past   Smokeless tobacco: Never   Tobacco comments:    Started smoking at age 52, quit smoking 2023.   Vaping Use   Vaping status: Former  Substance Use Topics   Alcohol use: Yes    Comment: occ   Drug use: No   Family History  Problem Relation Age of Onset   Breast cancer Mother    Heart failure Father    Diabetes type II Father    Heart failure Brother    Lupus Daughter    Diabetes type II  Maternal Grandmother    Colon cancer Neg Hx    Cervical cancer Neg Hx    No Known Allergies  Review of Systems  Musculoskeletal:  Positive for back pain (Chronic lumbar back pain).     Objective:     BP 126/84   Pulse 89   Ht 5\' 4"  (1.626 m)   Wt 217 lb 12.8 oz (98.8 kg)   LMP 03/06/2013   SpO2 96%   BMI 37.39 kg/m  BP Readings from Last 3 Encounters:  01/09/23 126/84  10/10/22 125/85  07/13/22 (!) 140/82   Physical Exam Vitals reviewed.  Constitutional:      General: She is not in acute distress.    Appearance: Normal appearance. She is obese. She is not toxic-appearing.  HENT:     Head: Normocephalic and atraumatic.     Nose: Nose normal. No congestion or rhinorrhea.     Mouth/Throat:     Mouth: Mucous membranes are moist.     Pharynx: Oropharynx is clear. No oropharyngeal exudate or posterior oropharyngeal erythema.  Eyes:     General: No scleral icterus.    Extraocular Movements: Extraocular movements intact.     Conjunctiva/sclera: Conjunctivae normal.     Pupils: Pupils are  equal, round, and reactive to light.  Cardiovascular:     Rate and Rhythm: Normal rate and regular rhythm.     Pulses: Normal pulses.     Heart sounds: Normal heart sounds. No murmur heard.    No friction rub. No gallop.  Pulmonary:     Effort: Pulmonary effort is normal. No respiratory distress.     Breath sounds: Normal breath sounds. No wheezing, rhonchi or rales.  Abdominal:     General: Abdomen is flat. Bowel sounds are normal. There is no distension.     Palpations: Abdomen is soft.     Tenderness: There is no abdominal tenderness.  Musculoskeletal:        General: No swelling or deformity.     Cervical back: Normal range of motion.     Right lower leg: No edema.     Left lower leg: No edema.  Skin:    General: Skin is warm and dry.     Capillary Refill: Capillary refill takes less than 2 seconds.     Coloration: Skin is not jaundiced.  Neurological:     General: No focal  deficit present.     Mental Status: She is alert and oriented to person, place, and time.     Motor: No weakness.     Gait: Gait normal.  Psychiatric:        Mood and Affect: Mood normal.        Behavior: Behavior normal.        Thought Content: Thought content normal.        Judgment: Judgment normal.   Last CBC Lab Results  Component Value Date   WBC 5.3 06/30/2022   HGB 12.0 06/30/2022   HCT 36.2 06/30/2022   MCV 93 06/30/2022   MCH 30.7 06/30/2022   RDW 13.0 06/30/2022   PLT 401 06/30/2022   Last metabolic panel Lab Results  Component Value Date   GLUCOSE 85 07/13/2022   NA 142 07/13/2022   K 4.1 07/13/2022   CL 106 07/13/2022   CO2 24 07/13/2022   BUN 9 07/13/2022   CREATININE 0.92 07/13/2022   EGFR 72 07/13/2022   CALCIUM 9.3 07/13/2022   PROT 7.1 07/13/2022   ALBUMIN 4.2 07/13/2022   LABGLOB 2.9 07/13/2022   AGRATIO 1.4 07/13/2022   BILITOT 0.3 07/13/2022   ALKPHOS 178 (H) 07/13/2022   AST 71 (H) 07/13/2022   ALT 118 (H) 07/13/2022   ANIONGAP 4 (L) 07/06/2016   Last lipids Lab Results  Component Value Date   CHOL 162 06/30/2022   HDL 52 06/30/2022   LDLCALC 98 06/30/2022   TRIG 59 06/30/2022   CHOLHDL 3.1 06/30/2022   Last hemoglobin A1c Lab Results  Component Value Date   HGBA1C 5.8 (H) 06/30/2022   Last thyroid functions Lab Results  Component Value Date   TSH 1.460 06/30/2022   Last vitamin D Lab Results  Component Value Date   VD25OH 23.9 (L) 06/30/2022   Last vitamin B12 and Folate Lab Results  Component Value Date   VITAMINB12 749 06/30/2022   FOLATE 3.4 06/30/2022   The 10-year ASCVD risk score (Arnett DK, et al., 2019) is: 9.6%    Assessment & Plan:   Problem List Items Addressed This Visit       Hepatic steatosis    Mild transaminase elevation noted on previous labs.  RUQ U/S obtained did not show any acute abnormalities but did show increased liver echogenicity, often consistent with hepatic steatosis. -Repeat  HFP  ordered today      Obesity (BMI 30-39.9) - Primary    Presenting today for weight loss follow-up.  Her current weight is 217 pounds.  Most recently, I prescribed Wegovy in September but it was not approved for unclear reasons.  Her BMI today is 37.3.  She is prediabetic and has elevated transaminases likely suggestive of underlying hepatic steatosis.  She is focused on lifestyle modifications aimed at weight loss and has recently joined a gym.  She is working with a Psychologist, educational on dietary changes as well. -New prescription for Wegovy 0.25 mg weekly sent today.  If not approved, she is in agreement with a referral to medical weight management. -We will tentatively plan for follow-up in 3 months       Return in about 3 months (around 04/09/2023).    [name], MD

## 2023-01-09 NOTE — Patient Instructions (Signed)
It was a pleasure to see you today.  Thank you for giving [place] the opportunity to be involved in your care.  Below is a brief recap of your visit and next steps.  We will plan to see you again in 3 months.  Summary We will try again to start [place] [facility]. I will refer you to healthy weight and wellness if it is not covered Follow up in 3 months

## 2023-01-09 NOTE — Assessment & Plan Note (Signed)
Mild transaminase elevation noted on previous labs.  RUQ U/S obtained did not show any acute abnormalities but did show increased liver echogenicity, often consistent with hepatic steatosis. -Repeat HFP ordered today

## 2023-01-09 NOTE — Assessment & Plan Note (Signed)
Presenting today for weight loss follow-up.  Her current weight is 217 pounds.  Most recently, I prescribed Wegovy in September but it was not approved for unclear reasons.  Her BMI today is 37.3.  She is prediabetic and has elevated transaminases likely suggestive of underlying hepatic steatosis.  She is focused on lifestyle modifications aimed at weight loss and has recently joined a gym.  She is working with a Psychologist, educational on dietary changes as well. -New prescription for Wegovy 0.25 mg weekly sent today.  If not approved, she is in agreement with a referral to medical weight management. -We will tentatively plan for follow-up in 3 months

## 2023-01-10 LAB — HEPATIC FUNCTION PANEL
ALT: 30 [IU]/L (ref 0–32)
AST: 25 [IU]/L (ref 0–40)
Albumin: 4.2 g/dL (ref 3.8–4.9)
Alkaline Phosphatase: 80 [IU]/L (ref 44–121)
Bilirubin Total: 0.2 mg/dL (ref 0.0–1.2)
Bilirubin, Direct: <0.08 mg/dL (ref 0.00–0.40)
Total Protein: 6.9 g/dL (ref 6.0–8.5)

## 2023-01-16 ENCOUNTER — Telehealth: Payer: Self-pay

## 2023-01-16 ENCOUNTER — Other Ambulatory Visit: Payer: Self-pay

## 2023-01-16 DIAGNOSIS — E669 Obesity, unspecified: Secondary | ICD-10-CM

## 2023-01-16 DIAGNOSIS — R7401 Elevation of levels of liver transaminase levels: Secondary | ICD-10-CM

## 2023-01-16 DIAGNOSIS — K760 Fatty (change of) liver, not elsewhere classified: Secondary | ICD-10-CM

## 2023-01-16 DIAGNOSIS — R7303 Prediabetes: Secondary | ICD-10-CM

## 2023-01-16 MED ORDER — SEMAGLUTIDE-WEIGHT MANAGEMENT 0.25 MG/0.5ML ~~LOC~~ SOAJ
0.2500 mg | SUBCUTANEOUS | 0 refills | Status: DC
Start: 2023-01-16 — End: 2023-02-01

## 2023-01-16 NOTE — Telephone Encounter (Signed)
Patient is aware her [employer]etna denied the med and she is asking that the med be sent [facility] to so she can get it through her medicaid

## 2023-01-16 NOTE — Telephone Encounter (Signed)
Copied from CRM [id number]. Topic: Clinical - Medication Question >> Jan 16, 2023  9:20 AM Gaetano Hawthorne wrote: Reason for CRM: Patient has questions for Dr. Darletta Moll nurse regarding her [facility] medication.

## 2023-01-17 NOTE — Telephone Encounter (Signed)
[employer] denied her wegovy due to them excluding all weight loss drugs from coverage. Pt is aware but states she also has medicaid that she was told covers the drug. PA form for medicaid submitted and pt asks that med be sent to [facility] in [place] and med sent as requested.

## 2023-02-01 ENCOUNTER — Other Ambulatory Visit: Payer: Self-pay | Admitting: Internal Medicine

## 2023-02-01 DIAGNOSIS — R7303 Prediabetes: Secondary | ICD-10-CM

## 2023-02-01 DIAGNOSIS — E669 Obesity, unspecified: Secondary | ICD-10-CM

## 2023-02-01 DIAGNOSIS — K760 Fatty (change of) liver, not elsewhere classified: Secondary | ICD-10-CM

## 2023-02-01 DIAGNOSIS — R7401 Elevation of levels of liver transaminase levels: Secondary | ICD-10-CM

## 2023-02-01 MED ORDER — SEMAGLUTIDE-WEIGHT MANAGEMENT 0.5 MG/0.5ML ~~LOC~~ SOAJ
0.5000 mg | SUBCUTANEOUS | 0 refills | Status: DC
Start: 2023-02-01 — End: 2023-02-05

## 2023-02-01 NOTE — Telephone Encounter (Signed)
Copied from CRM [id number]. Topic: Clinical - Medication Refill >> Feb 01, 2023  2:10 PM Carlatta H wrote: Most Recent Primary Care Visit:  Provider: [name]  Department: [facility]  Visit Type: OFFICE VISIT  Date: 01/09/2023  Medication: Semaglutide-Weight Management 0.25 MG/0.5ML Ivory Broad [[id number]]  Has the patient contacted their pharmacy? Yes (Agent: If no, request that the patient contact the pharmacy for the refill. If patient does not wish to contact the pharmacy document the reason why and proceed with request.) (Agent: If yes, when and what did the pharmacy advise?)  Is this the correct pharmacy for this prescription? Yes If no, delete pharmacy and type the correct one.  This is the patient's preferred pharmacy:  [id number] - [place] - [address] [place] [address] Phone: [phone] Fax: [phone]    Has the prescription been filled recently? No  Is the patient out of the medication? No  Has the patient been seen for an appointment in the last year OR does the patient have an upcoming appointment? Yes  Can we respond through MyChart? Yes  Agent: Please be advised that Rx refills may take up to 3 business days. We ask that you follow-up with your pharmacy.

## 2023-02-05 ENCOUNTER — Telehealth: Payer: Self-pay

## 2023-02-05 DIAGNOSIS — E669 Obesity, unspecified: Secondary | ICD-10-CM

## 2023-02-05 DIAGNOSIS — K760 Fatty (change of) liver, not elsewhere classified: Secondary | ICD-10-CM

## 2023-02-05 DIAGNOSIS — R7401 Elevation of levels of liver transaminase levels: Secondary | ICD-10-CM

## 2023-02-05 DIAGNOSIS — R7303 Prediabetes: Secondary | ICD-10-CM

## 2023-02-05 MED ORDER — SEMAGLUTIDE-WEIGHT MANAGEMENT 1 MG/0.5ML ~~LOC~~ SOAJ
1.0000 mg | SUBCUTANEOUS | 0 refills | Status: DC
Start: 2023-02-05 — End: 2023-02-11

## 2023-02-05 NOTE — Telephone Encounter (Signed)
Copied from CRM [id number]. Topic: Clinical - Prescription Issue >> Feb 02, 2023  3:51 PM Fuller Mandril wrote: Reason for CRM: Pt has taking 2 of the [facility]. Takes 3rd one tomorrow. Pt states pharmacy needs to know whether or not the next set should be for the same dose or increase. Can reach out to pharmacy to clarify. Thank You.

## 2023-02-05 NOTE — Telephone Encounter (Signed)
Duplicate phone message 

## 2023-02-05 NOTE — Telephone Encounter (Signed)
 Dosage increased and sent to pharmacy

## 2023-02-05 NOTE — Telephone Encounter (Signed)
Copied from CRM [id number]. Topic: Clinical - Medication Question >> Feb 05, 2023  9:39 AM Dennison Nancy wrote: Reason for CRM: patient checking on status of the   Semaglutide-Weight Management 0.25 MG/0.5ML SOAJ patient went this morning to pharmacy and told patient it was too early for the refill and to check back tomorrow , patient have question if have to call her provider everytime when need a refill

## 2023-02-05 NOTE — Telephone Encounter (Signed)
Copied from CRM [id number]. Topic: Clinical - Prescription Issue >> Feb 05, 2023  9:34 AM Dennison Nancy wrote: Reason for CRM: patient checking on status of the   Semaglutide-Weight Management 0.25 MG/0.5ML SOAJ patient went this morning to pharmacy and told patient it was too early for the refill and to check back tomorrow , patient have question if have to call her provider everytime when need a refill

## 2023-02-05 NOTE — Addendum Note (Signed)
Addended by: [name] on: 02/05/2023 08:42 AM   Modules accepted: Orders

## 2023-02-11 ENCOUNTER — Other Ambulatory Visit: Payer: Self-pay | Admitting: Internal Medicine

## 2023-02-11 DIAGNOSIS — R7303 Prediabetes: Secondary | ICD-10-CM

## 2023-02-11 DIAGNOSIS — E669 Obesity, unspecified: Secondary | ICD-10-CM

## 2023-02-11 DIAGNOSIS — R7401 Elevation of levels of liver transaminase levels: Secondary | ICD-10-CM

## 2023-02-11 DIAGNOSIS — K760 Fatty (change of) liver, not elsewhere classified: Secondary | ICD-10-CM

## 2023-02-12 MED ORDER — SEMAGLUTIDE-WEIGHT MANAGEMENT 1 MG/0.5ML ~~LOC~~ SOAJ
1.0000 mg | SUBCUTANEOUS | 0 refills | Status: AC
Start: 2023-02-12 — End: 2023-03-12

## 2023-02-14 DIAGNOSIS — M5416 Radiculopathy, lumbar region: Secondary | ICD-10-CM | POA: Diagnosis not present

## 2023-02-15 NOTE — Telephone Encounter (Signed)
 Copied from CRM [id number]. Topic: Clinical - Prescription Issue >> Feb 15, 2023 10:54 AM [name] wrote: Reason for CRM: Patient called stated pharmacy unable to fill prescription for Semaglutide -Weight Management 1 MG/0.5ML SOAJ due to provider will need to contact [employer] and let them know why he is prescribing and when it is needed. Patient states next dose due Saturday and she does not have any left. Thank You

## 2023-02-19 ENCOUNTER — Telehealth: Payer: Self-pay

## 2023-02-19 NOTE — Telephone Encounter (Signed)
 Copied from CRM [id number]. Topic: Clinical - Prescription Issue >> Feb 19, 2023  9:19 AM [name] wrote: Reason for CRM: Patient calling back about prescription issue. States pharmacy says they need provider to rewrite prescription for Semaglutide -Weight Management 1 MG/0.5ML. Insurance approved 12 units on 01/09/2023. Approved until 07/2023. Provider requested 24 units which was not approved so provider needs to change request to 12 units so patient is able to get filled. Patient is already past due. Thank You.

## 2023-02-21 ENCOUNTER — Other Ambulatory Visit: Payer: Self-pay | Admitting: Internal Medicine

## 2023-02-21 ENCOUNTER — Telehealth: Payer: Self-pay

## 2023-02-21 ENCOUNTER — Other Ambulatory Visit: Payer: Self-pay

## 2023-02-21 DIAGNOSIS — K760 Fatty (change of) liver, not elsewhere classified: Secondary | ICD-10-CM

## 2023-02-21 DIAGNOSIS — R7303 Prediabetes: Secondary | ICD-10-CM

## 2023-02-21 DIAGNOSIS — R7401 Elevation of levels of liver transaminase levels: Secondary | ICD-10-CM

## 2023-02-21 DIAGNOSIS — E669 Obesity, unspecified: Secondary | ICD-10-CM

## 2023-02-21 MED ORDER — SEMAGLUTIDE-WEIGHT MANAGEMENT 0.5 MG/0.5ML ~~LOC~~ SOAJ
0.5000 mg | SUBCUTANEOUS | 0 refills | Status: AC
Start: 2023-02-21 — End: 2023-03-21

## 2023-02-21 NOTE — Telephone Encounter (Signed)
 Patient called in again in regard to previous tele messages. Patient has not heard anything in regard and is wanting a call back

## 2023-02-21 NOTE — Telephone Encounter (Signed)
 [facility] not get [facility], has left vm and so has brandi , spoke to patient

## 2023-02-21 NOTE — Telephone Encounter (Signed)
 Sent correct rx to pharmacy

## 2023-02-21 NOTE — Telephone Encounter (Signed)
 Copied from CRM [id number]. Topic: Clinical - Prescription Issue >> Feb 21, 2023  9:52 AM Elle L wrote: Reason for CRM: The patient is requesting a call back from Dr. Assunta Lax nurse at [phone] regarding her Semaglutide -Weight Management 1 MG/0.5ML as she needs the office to call Medicaid to have the new prescription dosage approved.

## 2023-02-23 ENCOUNTER — Telehealth: Payer: Self-pay | Admitting: Internal Medicine

## 2023-02-23 NOTE — Telephone Encounter (Signed)
Copied from CRM [id number]. Topic: Clinical - Prescription Issue >> Feb 23, 2023 10:21 AM Nada Libman H wrote: Reason for CRM: Semaglutide-Weight Management 0.5 MG/0.5ML Ivory Broad [[id number]] needs to be sent for pre authorization//Pharmacy is not filling the prescription due to not be pre authorized//

## 2023-02-23 NOTE — Telephone Encounter (Signed)
Not our patient

## 2023-02-26 NOTE — Telephone Encounter (Signed)
Patient approved for six months, tried to contact pharmacy and insurance has left vm, faxed documents

## 2023-02-28 ENCOUNTER — Telehealth: Payer: Self-pay | Admitting: Internal Medicine

## 2023-02-28 NOTE — Telephone Encounter (Signed)
Patient has called back in regard to previous tele   Wants a call back in regard for an update on information.

## 2023-02-28 NOTE — Telephone Encounter (Signed)
Copied from CRM [id number]. Topic: Clinical - Prescription Issue >> Feb 27, 2023 10:24 AM Shelah Lewandowsky wrote: Reason for CRM: Wrong dosage was called in for Semaglutide-Weight Management and now having issues with Medicaid denying it because they were  not given a reason for the dosage change. They need a break down of how this is supposed to be dispensed to patient. Please call patient [phone]

## 2023-02-28 NOTE — Telephone Encounter (Signed)
Pt reports pcp should call track number to update them with any changes that are being made to her medications otherwise they will not cover this.

## 2023-02-28 NOTE — Telephone Encounter (Signed)
Copied from CRM [id number]. Topic: Appointments - Appointment Scheduling >> Feb 28, 2023  9:20 AM Dimitri Ped wrote: Patient/patient representative is calling to schedule an appointment. Refer to attachments for appointment information.  Patient is calling back to schedule a appointment someone called her about a 1 o clock slot for Dr. Jaci Lazier when asking the questions that pop up . It gave me a hard stop on the second question and said send message to [id number] nurse . [phone]

## 2023-02-28 NOTE — Telephone Encounter (Signed)
Lvm to call office

## 2023-03-02 ENCOUNTER — Other Ambulatory Visit: Payer: Self-pay

## 2023-03-02 DIAGNOSIS — E669 Obesity, unspecified: Secondary | ICD-10-CM

## 2023-03-02 MED ORDER — SEMAGLUTIDE-WEIGHT MANAGEMENT 0.25 MG/0.5ML ~~LOC~~ SOAJ
0.2500 mg | SUBCUTANEOUS | 0 refills | Status: AC
Start: 2023-03-02 — End: 2023-03-30

## 2023-03-02 NOTE — Telephone Encounter (Signed)
The dispense report doesn't show that she has ever filled wegovy 0.25 so a rx was sent back for the beginning dose since we have the approval dated until 07/10/23

## 2023-03-05 ENCOUNTER — Encounter: Payer: Self-pay | Admitting: Internal Medicine

## 2023-03-06 NOTE — Telephone Encounter (Unsigned)
Copied from CRM [id number]. Topic: Clinical - Medication Question >> Mar 06, 2023  1:08 PM Twila L wrote: Reason for CRM: Patient want Nurse to give her a call back about her Semaglutide-Weight Management 0.25 MG/0.5ML . Patient stts she has been sending message to Nurse  and has not calling back

## 2023-03-08 ENCOUNTER — Other Ambulatory Visit: Payer: Self-pay | Admitting: Internal Medicine

## 2023-03-08 DIAGNOSIS — J45909 Unspecified asthma, uncomplicated: Secondary | ICD-10-CM

## 2023-03-13 DIAGNOSIS — M47816 Spondylosis without myelopathy or radiculopathy, lumbar region: Secondary | ICD-10-CM | POA: Diagnosis not present

## 2023-03-15 ENCOUNTER — Telehealth: Payer: Self-pay

## 2023-03-15 NOTE — Telephone Encounter (Signed)
 Spoke to patient

## 2023-03-15 NOTE — Telephone Encounter (Signed)
 Copied from CRM [id number]. Topic: Clinical - Prescription Issue >> Mar 15, 2023 11:52 AM Deleta HERO wrote: Reason for CRM: Patient is still wanting an update for her issues with Wegovy , states she messaged her PCP on 01/28 and she spoke with her insurance and pharmacy and confirmed they have not received a call or voicemail from the office. She is wanting her PCP to contact [phone] for the approval of her dosage for Wegovy  and for a refill to be completed to her pharmacy. Callback #:[phone]

## 2023-04-10 ENCOUNTER — Telehealth: Payer: Medicare HMO | Admitting: Internal Medicine

## 2023-04-10 ENCOUNTER — Encounter: Payer: Self-pay | Admitting: Internal Medicine

## 2023-04-10 DIAGNOSIS — Z6830 Body mass index (BMI) 30.0-30.9, adult: Secondary | ICD-10-CM

## 2023-04-10 DIAGNOSIS — R7303 Prediabetes: Secondary | ICD-10-CM | POA: Diagnosis not present

## 2023-04-10 DIAGNOSIS — E669 Obesity, unspecified: Secondary | ICD-10-CM | POA: Diagnosis not present

## 2023-04-10 MED ORDER — SEMAGLUTIDE-WEIGHT MANAGEMENT 1 MG/0.5ML ~~LOC~~ SOAJ
1.0000 mg | SUBCUTANEOUS | 0 refills | Status: DC
Start: 2023-04-10 — End: 2023-06-11

## 2023-04-10 NOTE — Assessment & Plan Note (Signed)
 Evaluated today for follow-up through video encounter in the setting of obesity.  She is currently prescribed Wegovy 0.5 mg weekly.  She does not have a scale at home.  Her last documented weight is 217 pounds in early December 2024, however she feels that she is losing weight as she has noticed her waistline decreasing.  She has not experienced any adverse side effects since starting [facility] and has adhered to lifestyle modifications aimed at weight loss. -Increase Wegovy 1 mg weekly.  We reviewed that we will continue to gradually titrate up to 2.4 mg weekly in 4-week increments.  Lifestyle modifications aimed at weight loss were reinforced.  I requested that she check her weight sometime this week and send a MyChart message with an updated weight.  We will tentatively plan for follow-up in 3 months.

## 2023-04-10 NOTE — Progress Notes (Signed)
 Virtual Visit via Video Note  I connected with Cristina Harris on 04/10/23 at  8:00 AM EST by a video enabled telemedicine application and verified that I am speaking with the correct person using two identifiers.  Patient Location: Home Provider Location: Office/Clinic  I discussed the limitations, risks, security, and privacy concerns of performing an evaluation and management service by video and the availability of in person appointments. I also discussed with the patient that there may be a patient responsible charge related to this service. The patient expressed understanding and agreed to proceed.  Subjective: PCP: Cristina Lade, MD  Chief Complaint  Patient presents with   Obesity   Ms. Cristina has been evaluated today through virtual encounter for routine follow-up.  She was last seen by me in early December 2024.  Reginal Lutes was prescribed at that time.  In the interim she has been evaluated by PM&R in the setting of lumbar radiculopathy.  There have otherwise been no acute interval events.  Ms. Harris reports feeling well today.  She is asymptomatic and has no acute concerns to discuss.  She is taking Wegovy as prescribed and states that she is losing weight.  She does not have a scale at home but has noticed her waistline decreasing.  ROS: Per HPI  Current Outpatient Medications:    Semaglutide-Weight Management 1 MG/0.5ML SOAJ, Inject 1 mg into the skin once a week for 28 days., Disp: 2 mL, Rfl: 0   albuterol (VENTOLIN HFA) 108 (90 Base) MCG/ACT inhaler, INHALE 1 PUFF INTO THE LUNGS AS NEEDED FOR WHEEZING OR SHORTNESS OF BREATH., Disp: 18 each, Rfl: 1   amLODipine (NORVASC) 5 MG tablet, Take 1 tablet (5 mg total) by mouth daily., Disp: 90 tablet, Rfl: 3   atorvastatin (LIPITOR) 40 MG tablet, Take 1 tablet (40 mg total) by mouth daily., Disp: 90 tablet, Rfl: 3   ibuprofen (ADVIL) 800 MG tablet, Take 1 tablet (800 mg total) by mouth daily as needed (headache)., Disp: 30  tablet, Rfl: 1  Assessment and Plan:  Obesity (BMI 30-39.9) Assessment & Plan: Evaluated today for follow-up through video encounter in the setting of obesity.  She is currently prescribed Wegovy 0.5 mg weekly.  She does not have a scale at home.  Her last documented weight is 217 pounds in early December 2024, however she feels that she is losing weight as she has noticed her waistline decreasing.  She has not experienced any adverse side effects since starting [facility] and has adhered to lifestyle modifications aimed at weight loss. -Increase Wegovy 1 mg weekly.  We reviewed that we will continue to gradually titrate up to 2.4 mg weekly in 4-week increments.  Lifestyle modifications aimed at weight loss were reinforced.  I requested that she check her weight sometime this week and send a MyChart message with an updated weight.  We will tentatively plan for follow-up in 3 months.  Follow Up Instructions: Return in about 3 months (around 07/11/2023).   I discussed the assessment and treatment plan with the patient. The patient was provided an opportunity to ask questions, and all were answered. The patient agreed with the plan and demonstrated an understanding of the instructions.   The patient was advised to call back or seek an in-person evaluation if the symptoms worsen or if the condition fails to improve as anticipated.  The above assessment and management plan was discussed with the patient. The patient verbalized understanding of and has agreed to the management plan.  Cristina Lade, MD

## 2023-04-12 ENCOUNTER — Telehealth: Admitting: Family Medicine

## 2023-04-12 DIAGNOSIS — R6889 Other general symptoms and signs: Secondary | ICD-10-CM

## 2023-04-12 MED ORDER — OSELTAMIVIR PHOSPHATE 75 MG PO CAPS
75.0000 mg | ORAL_CAPSULE | Freq: Two times a day (BID) | ORAL | 0 refills | Status: AC
Start: 2023-04-12 — End: 2023-04-17

## 2023-04-12 MED ORDER — PROMETHAZINE-DM 6.25-15 MG/5ML PO SYRP
5.0000 mL | ORAL_SOLUTION | Freq: Three times a day (TID) | ORAL | 0 refills | Status: DC | PRN
Start: 2023-04-12 — End: 2023-07-12

## 2023-04-12 NOTE — Progress Notes (Signed)
 Virtual Visit Consent   Cristina Harris, you are scheduled for a virtual visit with a [facility] provider today. Just as with appointments in the office, your consent must be obtained to participate. Your consent will be active for this visit and any virtual visit you may have with one of our providers in the next 365 days. If you have a MyChart account, a copy of this consent can be sent to you electronically.  As this is a virtual visit, video technology does not allow for your provider to perform a traditional examination. This may limit your provider's ability to fully assess your condition. If your provider identifies any concerns that need to be evaluated in person or the need to arrange testing (such as labs, EKG, etc.), we will make arrangements to do so. Although advances in technology are sophisticated, we cannot ensure that it will always work on either your end or our end. If the connection with a video visit is poor, the visit may have to be switched to a telephone visit. With either a video or telephone visit, we are not always able to ensure that we have a secure connection.  By engaging in this virtual visit, you consent to the provision of healthcare and authorize for your insurance to be billed (if applicable) for the services provided during this visit. Depending on your insurance coverage, you may receive a charge related to this service.  I need to obtain your verbal consent now. Are you willing to proceed with your visit today? Cristina Harris has provided verbal consent on 04/12/2023 for a virtual visit (video or telephone). Cristina Finner, NP  Date: 04/12/2023 9:49 AM   Virtual Visit via Video Note   I, Cristina Harris, connected with  Cristina Harris  ([id number], 19-May-1965) on 04/12/23 at  9:45 AM EST by a video-enabled telemedicine application and verified that I am speaking with the correct person using two identifiers.  Location: Patient: Virtual Visit Location Patient:  Home Provider: Virtual Visit Location Provider: Home Office   I discussed the limitations of evaluation and management by telemedicine and the availability of in person appointments. The patient expressed understanding and agreed to proceed.    History of Present Illness: Cristina Harris is a 58 y.o. who identifies as a female who was assigned female at birth, and is being seen today for cold symptoms   Onset was in the 2 days- post nasal drip- yellow drainage Associated symptoms are coughing, and sensation of getting it hung in throat, scratchy throat, tired- drained Modifying factors are thera flu, severe cold and flu tablet  Denies chest pain, shortness of breath, fevers, chills  Exposure to sick contacts- known- with brother- he had a cough COVID test: not test Vaccines: not up to date     Problems:  Patient Active Problem List   Diagnosis Date Noted   Hepatic steatosis 07/13/2022   Prediabetes 07/13/2022   Encounter for well adult exam with abnormal findings 07/13/2022   Encounter for Papanicolaou smear of cervix 05/05/2022   Other spondylosis with radiculopathy, lumbar region 05/02/2022   Lower extremity numbness 04/18/2022   Positive colorectal cancer screening using Cologuard test 11/28/2021   Hyperlipidemia 11/28/2021   Problem related to unspecified psychosocial circumstances 11/07/2021   Tobacco use 10/17/2021   Amenorrhea 10/06/2021   Vitamin D insufficiency 08/02/2021   Preventative health care 07/08/2021   Obesity (BMI 30-39.9) 07/08/2021   Asthma 07/08/2021   Depression, major, single episode, moderate (HCC) 07/08/2021  Benign hypertension 05/25/2020   Bilateral carpal tunnel syndrome 04/21/2020   Degenerative tear of triangular fibrocartilage complex (TFCC) of right wrist 09/26/2019   Pain, wrist, right 09/26/2019    Allergies: No Known Allergies Medications:  Current Outpatient Medications:    albuterol (VENTOLIN HFA) 108 (90 Base) MCG/ACT inhaler,  INHALE 1 PUFF INTO THE LUNGS AS NEEDED FOR WHEEZING OR SHORTNESS OF BREATH., Disp: 18 each, Rfl: 1   amLODipine (NORVASC) 5 MG tablet, Take 1 tablet (5 mg total) by mouth daily., Disp: 90 tablet, Rfl: 3   atorvastatin (LIPITOR) 40 MG tablet, Take 1 tablet (40 mg total) by mouth daily., Disp: 90 tablet, Rfl: 3   ibuprofen (ADVIL) 800 MG tablet, Take 1 tablet (800 mg total) by mouth daily as needed (headache)., Disp: 30 tablet, Rfl: 1   Semaglutide-Weight Management 1 MG/0.5ML SOAJ, Inject 1 mg into the skin once a week for 28 days., Disp: 2 mL, Rfl: 0  Observations/Objective: Patient is well-developed, well-nourished in no acute distress.  Resting comfortably  at home.  Head is normocephalic, atraumatic.  No labored breathing.  Speech is clear and coherent with logical content.  Patient is alert and oriented at baseline.    Assessment and Plan:  1. Flu-like symptoms (Primary)  - oseltamivir (TAMIFLU) 75 MG capsule; Take 1 capsule (75 mg total) by mouth 2 (two) times daily for 5 days.  Dispense: 10 capsule; Refill: 0 - promethazine-dextromethorphan (PROMETHAZINE-DM) 6.25-15 MG/5ML syrup; Take 5 mLs by mouth 3 (three) times daily as needed for cough.  Dispense: 118 mL; Refill: 0  - Continue OTC symptomatic management of choice - Take prescribed medications as directed - Push fluids - Rest as needed - Discussed return precautions and when to seek in-person evaluation, sent via AVS as well  Reviewed side effects, risks and benefits of medication.    Patient acknowledged agreement and understanding of the plan.   Past Medical, Surgical, Social History, Allergies, and Medications have been Reviewed.   Follow Up Instructions: I discussed the assessment and treatment plan with the patient. The patient was provided an opportunity to ask questions and all were answered. The patient agreed with the plan and demonstrated an understanding of the instructions.  A copy of instructions were sent  to the patient via MyChart unless otherwise noted below.    The patient was advised to call back or seek an in-person evaluation if the symptoms worsen or if the condition fails to improve as anticipated.    Cristina Finner, NP

## 2023-04-12 NOTE — Patient Instructions (Addendum)
 Cristina Harris, thank you for joining Freddy Finner, NP for today's virtual visit.  While this provider is not your primary care provider (PCP), if your PCP is located in our provider database this encounter information will be shared with them immediately following your visit.   A [facility] MyChart account gives you access to today's visit and all your visits, tests, and labs performed at [facility] " click here if you don't have a [facility] MyChart account or go to [email or web address]  Consent: (Patient) Cristina Harris provided verbal consent for this virtual visit at the beginning of the encounter.  Current Medications:  Current Outpatient Medications:    oseltamivir (TAMIFLU) 75 MG capsule, Take 1 capsule (75 mg total) by mouth 2 (two) times daily for 5 days., Disp: 10 capsule, Rfl: 0   promethazine-dextromethorphan (PROMETHAZINE-DM) 6.25-15 MG/5ML syrup, Take 5 mLs by mouth 3 (three) times daily as needed for cough., Disp: 118 mL, Rfl: 0   albuterol (VENTOLIN HFA) 108 (90 Base) MCG/ACT inhaler, INHALE 1 PUFF INTO THE LUNGS AS NEEDED FOR WHEEZING OR SHORTNESS OF BREATH., Disp: 18 each, Rfl: 1   amLODipine (NORVASC) 5 MG tablet, Take 1 tablet (5 mg total) by mouth daily., Disp: 90 tablet, Rfl: 3   atorvastatin (LIPITOR) 40 MG tablet, Take 1 tablet (40 mg total) by mouth daily., Disp: 90 tablet, Rfl: 3   ibuprofen (ADVIL) 800 MG tablet, Take 1 tablet (800 mg total) by mouth daily as needed (headache)., Disp: 30 tablet, Rfl: 1   Semaglutide-Weight Management 1 MG/0.5ML SOAJ, Inject 1 mg into the skin once a week for 28 days., Disp: 2 mL, Rfl: 0   Medications ordered in this encounter:  Meds ordered this encounter  Medications   oseltamivir (TAMIFLU) 75 MG capsule    Sig: Take 1 capsule (75 mg total) by mouth 2 (two) times daily for 5 days.    Dispense:  10 capsule    Refill:  0    Supervising Provider:   Merrilee Jansky [[id number]]    promethazine-dextromethorphan (PROMETHAZINE-DM) 6.25-15 MG/5ML syrup    Sig: Take 5 mLs by mouth 3 (three) times daily as needed for cough.    Dispense:  118 mL    Refill:  0    Supervising Provider:   Merrilee Jansky [[id number]]     *If you need refills on other medications prior to your next appointment, please contact your pharmacy*  Follow-Up: Call back or seek an in-person evaluation if the symptoms worsen or if the condition fails to improve as anticipated.  [facility] [phone]  Other Instructions Influenza, Adult Influenza is also called the flu. It's an infection that affects your respiratory tract. This includes your nose, throat, windpipe, and lungs. The flu is contagious. This means it spreads easily from person to person. It causes symptoms that are like a cold. It can also cause a high fever and body aches. What are the causes? The flu is caused by the influenza virus. You can get it by: Breathing in droplets that are in the air after an infected person coughs or sneezes. Touching something that has the virus on it and then touching your mouth, nose, or eyes. What increases the risk? You may be more likely to get the flu if: You don't wash your hands often. You're near a lot of people during cold and flu season. You touch your mouth, eyes, or nose without washing your hands first. You don't get a flu shot  each year. You may also be more at risk for the flu and serious problems, such as a lung infection called pneumonia, if: You're older than 65. You're pregnant. Your immune system is weak. Your immune system is your body's defense system. You have a long-term, or chronic, condition, such as: Heart, kidney, or lung disease. Diabetes. A liver disorder. Asthma. You're very overweight. You have anemia. This is when you don't have enough red blood cells in your body. What are the signs or symptoms? Flu symptoms often start all of a sudden. They may  last 4-14 days and include: Fever and chills. Headaches, body aches, or muscle aches. Sore throat. Cough. Runny or stuffy nose. Discomfort in your chest. Not wanting to eat as much as normal. Feeling weak or tired. Feeling dizzy. Nausea or vomiting. How is this diagnosed? The flu may be diagnosed based on your symptoms and medical history. You may also have a physical exam. A swab may be taken from your nose or throat and tested for the virus. How is this treated? If the flu is found early, you can be treated with antiviral medicine. This may be given to you by mouth or through an IV. It can help you feel less sick and get better faster. Taking care of yourself at home can also help your symptoms get better. Your health care provider may tell you to: Take over-the-counter medicines. Drink lots of fluids. The flu often goes away on its own. If you have very bad symptoms or problems caused by the flu, you may need to be treated in a hospital. Follow these instructions at home: Activity Rest as needed. Get lots of sleep. Stay home from work or school as told by your provider. Leave home only to go see your provider. Do not leave home for other Harris until you don't have a fever for 24 hours without taking medicine. Eating and drinking Take an oral rehydration solution (ORS). This is a drink that is sold at pharmacies and stores. Drink enough fluid to keep your pee pale yellow. Try to drink small amounts of clear fluids. These include water, ice chips, fruit juice mixed with water, and low-calorie sports drinks. Try to eat bland foods that are easy to digest. These include bananas, applesauce, rice, lean meats, toast, and crackers. Avoid drinks that have a lot of sugar or caffeine in them. These include energy drinks, regular sports drinks, and soda. Do not drink alcohol. Do not eat spicy or fatty foods. General instructions     Take your medicines only as told by your  provider. Use a cool mist humidifier to add moisture to the air in your home. This can make it easier for you to breathe. You should also clean the humidifier every day. To do so: Empty the water. Pour clean water in. Cover your mouth and nose when you cough or sneeze. Wash your hands with soap and water often and for at least 20 seconds. It's extra important to do so after you cough or sneeze. If you can't use soap and water, use hand sanitizer. How is this prevented?  Get a flu shot every year. Ask your provider when you should get your flu shot. Stay away from people who are sick during fall and winter. Fall and winter are cold and flu season. Contact a health care provider if: You get new symptoms. You have chest pain. You have watery poop, also called diarrhea. You have a fever. Your cough gets worse. You  start to have more mucus. You feel like you may vomit, or you vomit. Get help right away if: You become short of breath or have trouble breathing. Your skin or nails turn blue. You have very bad pain or stiffness in your neck. You get a sudden headache or pain in your face or ear. You vomit each time you eat or drink. These symptoms may be an emergency. Call 911 right away. Do not wait to see if the symptoms will go away. Do not drive yourself to the hospital. This information is not intended to replace advice given to you by your health care provider. Make sure you discuss any questions you have with your health care provider. Document Revised: 10/26/2022 Document Reviewed: 03/02/2022 Elsevier Patient Education  2024 Elsevier Inc.    If you have been instructed to have an in-person evaluation today at a local Urgent Care facility, please use the link below. It will take you to a list of all of our available [facility], including address, phone number and hours of operation. Please do not delay care.  [facility]  If you or a family member do not  have a primary care provider, use the link below to schedule a visit and establish care. When you choose a [facility] primary care physician or advanced practice provider, you gain a long-term partner in health. Find a Primary Care Provider  Learn more about [facility]'s in-office and virtual care options: [facility] - Get Care Now

## 2023-05-06 ENCOUNTER — Other Ambulatory Visit: Payer: Self-pay | Admitting: Internal Medicine

## 2023-05-06 DIAGNOSIS — J45909 Unspecified asthma, uncomplicated: Secondary | ICD-10-CM

## 2023-06-01 DIAGNOSIS — M47816 Spondylosis without myelopathy or radiculopathy, lumbar region: Secondary | ICD-10-CM | POA: Diagnosis not present

## 2023-06-05 DIAGNOSIS — I10 Essential (primary) hypertension: Secondary | ICD-10-CM | POA: Diagnosis not present

## 2023-06-05 DIAGNOSIS — E785 Hyperlipidemia, unspecified: Secondary | ICD-10-CM | POA: Diagnosis not present

## 2023-06-05 DIAGNOSIS — M5430 Sciatica, unspecified side: Secondary | ICD-10-CM | POA: Diagnosis not present

## 2023-06-05 DIAGNOSIS — F320 Major depressive disorder, single episode, mild: Secondary | ICD-10-CM | POA: Diagnosis not present

## 2023-06-05 DIAGNOSIS — M1990 Unspecified osteoarthritis, unspecified site: Secondary | ICD-10-CM | POA: Diagnosis not present

## 2023-06-05 DIAGNOSIS — E669 Obesity, unspecified: Secondary | ICD-10-CM | POA: Diagnosis not present

## 2023-06-05 DIAGNOSIS — Z809 Family history of malignant neoplasm, unspecified: Secondary | ICD-10-CM | POA: Diagnosis not present

## 2023-06-05 DIAGNOSIS — M4800 Spinal stenosis, site unspecified: Secondary | ICD-10-CM | POA: Diagnosis not present

## 2023-06-05 DIAGNOSIS — K760 Fatty (change of) liver, not elsewhere classified: Secondary | ICD-10-CM | POA: Diagnosis not present

## 2023-06-05 DIAGNOSIS — Z008 Encounter for other general examination: Secondary | ICD-10-CM | POA: Diagnosis not present

## 2023-06-05 DIAGNOSIS — Z720 Tobacco use: Secondary | ICD-10-CM | POA: Diagnosis not present

## 2023-06-05 DIAGNOSIS — Z8249 Family history of ischemic heart disease and other diseases of the circulatory system: Secondary | ICD-10-CM | POA: Diagnosis not present

## 2023-06-05 DIAGNOSIS — Z833 Family history of diabetes mellitus: Secondary | ICD-10-CM | POA: Diagnosis not present

## 2023-06-11 ENCOUNTER — Other Ambulatory Visit: Payer: Self-pay | Admitting: Internal Medicine

## 2023-06-11 DIAGNOSIS — E669 Obesity, unspecified: Secondary | ICD-10-CM

## 2023-06-11 DIAGNOSIS — R7303 Prediabetes: Secondary | ICD-10-CM

## 2023-07-03 DIAGNOSIS — H524 Presbyopia: Secondary | ICD-10-CM | POA: Diagnosis not present

## 2023-07-03 DIAGNOSIS — H52223 Regular astigmatism, bilateral: Secondary | ICD-10-CM | POA: Diagnosis not present

## 2023-07-03 DIAGNOSIS — H2513 Age-related nuclear cataract, bilateral: Secondary | ICD-10-CM | POA: Diagnosis not present

## 2023-07-03 DIAGNOSIS — H5213 Myopia, bilateral: Secondary | ICD-10-CM | POA: Diagnosis not present

## 2023-07-09 DIAGNOSIS — M47816 Spondylosis without myelopathy or radiculopathy, lumbar region: Secondary | ICD-10-CM | POA: Diagnosis not present

## 2023-07-12 ENCOUNTER — Ambulatory Visit: Admitting: Internal Medicine

## 2023-07-12 ENCOUNTER — Ambulatory Visit (INDEPENDENT_AMBULATORY_CARE_PROVIDER_SITE_OTHER)

## 2023-07-12 VITALS — BP 130/87 | HR 80 | Ht 64.0 in | Wt 192.0 lb

## 2023-07-12 DIAGNOSIS — F5101 Primary insomnia: Secondary | ICD-10-CM | POA: Diagnosis not present

## 2023-07-12 DIAGNOSIS — Z6832 Body mass index (BMI) 32.0-32.9, adult: Secondary | ICD-10-CM | POA: Diagnosis not present

## 2023-07-12 DIAGNOSIS — E559 Vitamin D deficiency, unspecified: Secondary | ICD-10-CM | POA: Diagnosis not present

## 2023-07-12 DIAGNOSIS — E669 Obesity, unspecified: Secondary | ICD-10-CM | POA: Diagnosis not present

## 2023-07-12 DIAGNOSIS — R7303 Prediabetes: Secondary | ICD-10-CM | POA: Diagnosis not present

## 2023-07-12 MED ORDER — WEGOVY 1.7 MG/0.75ML ~~LOC~~ SOAJ: 1.7000 mg | SUBCUTANEOUS | 2 refills | Status: DC

## 2023-07-12 MED ORDER — ZOLPIDEM TARTRATE 5 MG PO TABS
5.0000 mg | ORAL_TABLET | Freq: Every evening | ORAL | 0 refills | Status: DC | PRN
Start: 2023-07-12 — End: 2023-10-25

## 2023-07-12 MED ORDER — VITAMIN D3 25 MCG (1000 UT) PO CAPS: 1000.0000 [IU] | ORAL_CAPSULE | Freq: Every day | ORAL | Status: DC
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
## 2023-07-12 NOTE — Assessment & Plan Note (Signed)
 Lab Results  Component Value Date   HGBA1C 5.8 (H) 06/30/2022   HGBA1C 5.5 07/11/2021   Recheck A1c today.  I anticipate improvement with recent weight loss.  Continue with healthy diet and regular exercise

## 2023-07-12 NOTE — Assessment & Plan Note (Signed)
 She is currently taking over-the-counter vitamin D  supplement.  Recheck levels today.
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
## 2023-07-12 NOTE — Assessment & Plan Note (Signed)
 She has not experienced any adverse side effects since starting Wegovy  and has adhered to lifestyle modifications aimed at weight loss. -Continue with 1.7 mg dose. Lifestyle modifications aimed at weight loss were reinforced.   We will plan for follow-up in 3 month

## 2023-07-12 NOTE — Patient Instructions (Signed)
 Wegovy  renewed today. Continue with current weight loss efforts including healthy diet and regular exercise. Recommend follow-up in 3 months for reevaluation or sooner if needed.

## 2023-07-13 ENCOUNTER — Telehealth: Payer: Self-pay | Admitting: Pharmacy Technician

## 2023-07-13 ENCOUNTER — Other Ambulatory Visit (HOSPITAL_COMMUNITY): Payer: Self-pay

## 2023-07-13 NOTE — Telephone Encounter (Signed)
 Pharmacy Patient Advocate Encounter   Received notification from CoverMyMeds that prior authorization for Wegovy  1.7MG /0.75ML auto-injectors is required/requested.   Insurance verification completed.   The patient is insured through [facility] MEDICAID .   Per test claim: PA required; PA submitted to above mentioned insurance via Fax Key/confirmation #/[id number]   Status is pending

## 2023-07-14 ENCOUNTER — Other Ambulatory Visit: Payer: Self-pay | Admitting: Internal Medicine

## 2023-07-14 DIAGNOSIS — J45909 Unspecified asthma, uncomplicated: Secondary | ICD-10-CM

## 2023-07-16 ENCOUNTER — Other Ambulatory Visit (HOSPITAL_COMMUNITY): Payer: Self-pay

## 2023-07-16 NOTE — Telephone Encounter (Signed)
 Pharmacy Patient Advocate Encounter  Received notification from [place] MEDICAID that Prior Authorization for Wegovy  1.7MG /0.75ML auto-injectors  has been APPROVED from 07/13/2023 to 07/08/2024. Unable to obtain price due to refill too soon rejection, last fill date 07/13/2023 next available fill date06/25/2025.

## 2023-07-17 LAB — HEMOGLOBIN A1C
Est. average glucose Bld gHb Est-mCnc: 100 mg/dL
Hgb A1c MFr Bld: 5.1 % (ref 4.8–5.6)

## 2023-07-17 LAB — VITAMIN D 25 HYDROXY (VIT D DEFICIENCY, FRACTURES): Vit D, 25-Hydroxy: 35.4 ng/mL (ref 30.0–100.0)

## 2023-08-06 ENCOUNTER — Ambulatory Visit: Payer: Self-pay

## 2023-09-07 ENCOUNTER — Other Ambulatory Visit: Payer: Self-pay | Admitting: Medical Genetics

## 2023-09-10 ENCOUNTER — Other Ambulatory Visit (HOSPITAL_COMMUNITY)

## 2023-09-17 ENCOUNTER — Other Ambulatory Visit (HOSPITAL_COMMUNITY)

## 2023-09-30 ENCOUNTER — Other Ambulatory Visit: Payer: Self-pay | Admitting: Internal Medicine

## 2023-09-30 ENCOUNTER — Other Ambulatory Visit: Payer: Self-pay

## 2023-09-30 DIAGNOSIS — E669 Obesity, unspecified: Secondary | ICD-10-CM

## 2023-09-30 DIAGNOSIS — R7303 Prediabetes: Secondary | ICD-10-CM

## 2023-09-30 DIAGNOSIS — J45909 Unspecified asthma, uncomplicated: Secondary | ICD-10-CM

## 2023-10-01 ENCOUNTER — Other Ambulatory Visit: Payer: Self-pay

## 2023-10-01 DIAGNOSIS — R7303 Prediabetes: Secondary | ICD-10-CM

## 2023-10-01 DIAGNOSIS — E669 Obesity, unspecified: Secondary | ICD-10-CM

## 2023-10-01 MED ORDER — WEGOVY 1.7 MG/0.75ML ~~LOC~~ SOAJ: 1.7000 mg | SUBCUTANEOUS | 2 refills | Status: DC

## 2023-10-01 NOTE — Telephone Encounter (Signed)
 Copied from CRM #[id number]. Topic: Clinical - Medication Refill >> Oct 01, 2023  9:32 AM Carlatta H wrote: Medication: Semaglutide -Weight Management (WEGOVY ) 1.7 MG/0.75ML SOAJ  Has the patient contacted their pharmacy? Yes (Agent: If no, request that the patient contact the pharmacy for the refill. If patient does not wish to contact the pharmacy document the reason why and proceed with request.) (Agent: If yes, when and what did the pharmacy advise?)Advised to contact office  This is the patient's preferred pharmacy:    [id number] - [place], [place] - [address] [facility] [address] [place] [address] Phone: [phone] Fax: [phone]  Is this the correct pharmacy for this prescription? Yes If no, delete pharmacy and type the correct one.   Has the prescription been filled recently? No  Is the patient out of the medication? No  Has the patient been seen for an appointment in the last year OR does the patient have an upcoming appointment? Yes  Can we respond through MyChart? No  Agent: Please be advised that Rx refills may take up to 3 business days. We ask that you follow-up with your pharmacy.

## 2023-10-04 ENCOUNTER — Other Ambulatory Visit (HOSPITAL_COMMUNITY): Payer: Self-pay

## 2023-10-12 ENCOUNTER — Ambulatory Visit

## 2023-10-12 VITALS — BP 122/80 | HR 77 | Wt 177.1 lb

## 2023-10-12 DIAGNOSIS — M4726 Other spondylosis with radiculopathy, lumbar region: Secondary | ICD-10-CM

## 2023-10-12 DIAGNOSIS — E669 Obesity, unspecified: Secondary | ICD-10-CM | POA: Diagnosis not present

## 2023-10-12 DIAGNOSIS — Z6830 Body mass index (BMI) 30.0-30.9, adult: Secondary | ICD-10-CM | POA: Diagnosis not present

## 2023-10-12 NOTE — Progress Notes (Signed)
 Established Patient Office Visit  Subjective   Patient ID: Cristina Harris, female    DOB: [birth year]  Age: 58 y.o. MRN: [id number]  Chief Complaint  Patient presents with   Follow-up    3 month, wegovy     HPI Discussed the use of AI scribe software for clinical note transcription with the patient, who gave verbal consent to proceed.  History of Present Illness   A 58 year old female who presents for a follow-up visit regarding weight management and back pain.  Weight management - Significant weight loss from 217 pounds in December to 177 pounds currently - Weight reduction attributed to regular gym workouts and adherence to prescribed medication regimen - Currently taking Wegovy , with plans to increase dose to 2.4 mg - Satisfied with progress and aims to reach a weight of 145-150 pounds - Height is 5'4  Chronic back pain - Chronic back pain associated with prior steroid injections - Believes steroid injections weakened her bones - Steroid injections previously administered by a physician in [place]; declined further injections - Concerned about potential nerve damage from injections - Currently managing back pain through weight loss and self-care; has decided against further procedures      Patient Active Problem List   Diagnosis Date Noted   Primary insomnia 07/12/2023   Hepatic steatosis 07/13/2022   Prediabetes 07/13/2022   Encounter for well adult exam with abnormal findings 07/13/2022   Encounter for Papanicolaou smear of cervix 05/05/2022   Other spondylosis with radiculopathy, lumbar region 05/02/2022   Lower extremity numbness 04/18/2022   Positive colorectal cancer screening using Cologuard test 11/28/2021   Hyperlipidemia 11/28/2021   Problem related to unspecified psychosocial circumstances 11/07/2021   Tobacco use 10/17/2021   Amenorrhea 10/06/2021   Vitamin D  insufficiency 08/02/2021   Preventative health care 07/08/2021   Obesity (BMI 30-39.9)  07/08/2021   Asthma 07/08/2021   Depression, major, single episode, moderate (HCC) 07/08/2021   Benign hypertension 05/25/2020   Bilateral carpal tunnel syndrome 04/21/2020   Degenerative tear of triangular fibrocartilage complex (TFCC) of right wrist 09/26/2019   Pain, wrist, right 09/26/2019      ROS    Objective:     BP 122/80 (BP Location: Left Arm, Patient Position: Sitting, Cuff Size: Large)   Pulse 77   Wt 177 lb 1.3 oz (80.3 kg)   LMP 03/06/2013   SpO2 96%   BMI 30.40 kg/m  BP Readings from Last 3 Encounters:  10/12/23 122/80  07/12/23 130/87  01/09/23 126/84   Wt Readings from Last 3 Encounters:  10/12/23 177 lb 1.3 oz (80.3 kg)  07/12/23 192 lb (87.1 kg)  01/09/23 217 lb 12.8 oz (98.8 kg)     Physical Exam Vitals and nursing note reviewed.  Constitutional:      Appearance: Normal appearance. She is obese.  HENT:     Head: Normocephalic.     Right Ear: Tympanic membrane, ear canal and external ear normal.     Left Ear: Tympanic membrane, ear canal and external ear normal.     Nose: Nose normal.     Mouth/Throat:     Mouth: Mucous membranes are moist.     Pharynx: Oropharynx is clear.  Eyes:     Extraocular Movements: Extraocular movements intact.     Pupils: Pupils are equal, round, and reactive to light.  Cardiovascular:     Rate and Rhythm: Normal rate and regular rhythm.  Pulmonary:     Effort: Pulmonary effort is normal.  Breath sounds: Normal breath sounds.  Musculoskeletal:     Cervical back: Normal range of motion and neck supple.  Skin:    General: Skin is warm and dry.  Neurological:     Mental Status: She is alert and oriented to person, place, and time.  Psychiatric:        Mood and Affect: Mood normal.        Thought Content: Thought content normal.      No results found for any visits on 10/12/23.  Last CBC Lab Results  Component Value Date   WBC 5.3 06/30/2022   HGB 12.0 06/30/2022   HCT 36.2 06/30/2022   MCV 93  06/30/2022   MCH 30.7 06/30/2022   RDW 13.0 06/30/2022   PLT 401 06/30/2022   Last metabolic panel Lab Results  Component Value Date   GLUCOSE 85 07/13/2022   NA 142 07/13/2022   K 4.1 07/13/2022   CL 106 07/13/2022   CO2 24 07/13/2022   BUN 9 07/13/2022   CREATININE 0.92 07/13/2022   EGFR 72 07/13/2022   CALCIUM  9.3 07/13/2022   PROT 6.9 01/09/2023   ALBUMIN 4.2 01/09/2023   LABGLOB 2.9 07/13/2022   AGRATIO 1.4 07/13/2022   BILITOT 0.2 01/09/2023   ALKPHOS 80 01/09/2023   AST 25 01/09/2023   ALT 30 01/09/2023   ANIONGAP 4 (L) 07/06/2016   Last lipids Lab Results  Component Value Date   CHOL 162 06/30/2022   HDL 52 06/30/2022   LDLCALC 98 06/30/2022   TRIG 59 06/30/2022   CHOLHDL 3.1 06/30/2022   Last hemoglobin A1c Lab Results  Component Value Date   HGBA1C 5.1 07/12/2023   Last thyroid functions Lab Results  Component Value Date   TSH 1.460 06/30/2022   Last vitamin D  Lab Results  Component Value Date   VD25OH 35.4 07/12/2023   Last vitamin B12 and Folate Lab Results  Component Value Date   VITAMINB12 749 06/30/2022   FOLATE 3.4 06/30/2022     The 10-year ASCVD risk score (Arnett DK, et al., 2019) is: 3.5%    Assessment & Plan:   Problem List Items Addressed This Visit       Nervous and Auditory   Other spondylosis with radiculopathy, lumbar region   Chronic low back pain improved with weight loss. Declined steroid injections and nerve burning, preferring weight loss and self-care. - Encourage continued weight loss as a strategy to manage back pain.        Other   Obesity (BMI 30-39.9) - Primary   Obesity with significant weight loss from 217 pounds in December to 177 pounds currently. Actively engaged in weight management through exercise and medication (Wegovy ). - Continue Wegovy  and increase dose to 2.4 mg as planned. - Encourage continued exercise and weight management efforts. - Reach out for the next dose of Wegovy  after completing  the current round.           Return in about 4 months (around 02/11/2024) for chronic follow-up with PCP.    Leita Longs, FNP

## 2023-10-15 NOTE — Assessment & Plan Note (Signed)
 Obesity with significant weight loss from 217 pounds in December to 177 pounds currently. Actively engaged in weight management through exercise and medication (Wegovy ). - Continue Wegovy  and increase dose to 2.4 mg as planned. - Encourage continued exercise and weight management efforts. - Reach out for the next dose of Wegovy  after completing the current round.

## 2023-10-15 NOTE — Assessment & Plan Note (Signed)
 Chronic low back pain improved with weight loss. Declined steroid injections and nerve burning, preferring weight loss and self-care. - Encourage continued weight loss as a strategy to manage back pain.

## 2023-10-25 ENCOUNTER — Other Ambulatory Visit: Payer: Self-pay

## 2023-10-25 DIAGNOSIS — F5101 Primary insomnia: Secondary | ICD-10-CM

## 2023-10-25 NOTE — Telephone Encounter (Unsigned)
 Copied from CRM [id number]. Topic: Clinical - Medication Refill >> Oct 25, 2023  2:56 PM Teressa P wrote: Medication: Zolpidem  5 mg  Has the patient contacted their pharmacy? No  This is the patient's preferred pharmacy:   [id number] [place], [place] - [address] [facility] [address] [place] [address] Phone: [phone] Fax: [phone]  Is this the correct pharmacy for this prescription? Yes If no, delete pharmacy and type the correct one.   Has the prescription been filled recently? Yes  Is the patient out of the medication? No  Has the patient been seen for an appointment in the last year OR does the patient have an upcoming appointment? No  Can we respond through MyChart? No  Agent: Please be advised that Rx refills may take up to 3 business days. We ask that you follow-up with your pharmacy.

## 2023-10-26 MED ORDER — ZOLPIDEM TARTRATE 5 MG PO TABS
5.0000 mg | ORAL_TABLET | Freq: Every evening | ORAL | 0 refills | Status: DC | PRN
Start: 2023-10-26 — End: 2023-12-10

## 2023-10-29 ENCOUNTER — Other Ambulatory Visit: Payer: Self-pay | Admitting: Internal Medicine

## 2023-10-29 DIAGNOSIS — E782 Mixed hyperlipidemia: Secondary | ICD-10-CM

## 2023-11-04 ENCOUNTER — Other Ambulatory Visit: Payer: Self-pay | Admitting: Internal Medicine

## 2023-11-04 DIAGNOSIS — I10 Essential (primary) hypertension: Secondary | ICD-10-CM

## 2023-11-05 ENCOUNTER — Other Ambulatory Visit (HOSPITAL_COMMUNITY): Payer: Self-pay

## 2023-11-05 ENCOUNTER — Telehealth: Payer: Self-pay | Admitting: Pharmacy Technician

## 2023-11-05 NOTE — Telephone Encounter (Signed)
 Pharmacy Patient Advocate Encounter   Received notification from CoverMyMeds that prior authorization for Wegovy  1.7MG /0.75ML auto-injectors is required/requested.   Insurance verification completed.   The patient is insured through [facility] MEDICAID .   Effective October 1st, Medicaid will discontinue coverage of GLP1 medications for weight loss (such as Wegovy  and Zepbound), unless the patient has a documented history of a heart attack or stroke. Zepbound will continue to be covered only for patients with moderate to severe sleep apnea (AHI 15-30). Because of this change, the prior authorization team will not be submitting new PA requests for GLP1 medications prescribed for weight loss between now and October 1st, as patients will be unable to continue therapy under Medicaid coverage.

## 2023-11-07 ENCOUNTER — Telehealth: Payer: Self-pay

## 2023-11-07 NOTE — Telephone Encounter (Signed)
 Copied from CRM [id number]. Topic: Clinical - Medication Question >> Nov 07, 2023  2:06 PM Cristina Harris wrote: Reason for CRM: Patient had some questions about her medication WEGOVY , I did advised her on the note that left by [facility] regarding Medicaid discontinuing coverage for the medication but she stated it was requested before that. Callback number [phone]

## 2023-11-21 ENCOUNTER — Encounter (INDEPENDENT_AMBULATORY_CARE_PROVIDER_SITE_OTHER): Payer: Self-pay | Admitting: Gastroenterology

## 2023-11-23 ENCOUNTER — Other Ambulatory Visit (HOSPITAL_COMMUNITY): Payer: Self-pay

## 2023-11-23 DIAGNOSIS — Z1231 Encounter for screening mammogram for malignant neoplasm of breast: Secondary | ICD-10-CM

## 2023-11-29 ENCOUNTER — Ambulatory Visit (HOSPITAL_COMMUNITY)

## 2023-11-29 ENCOUNTER — Encounter (HOSPITAL_COMMUNITY): Payer: Self-pay

## 2023-12-03 ENCOUNTER — Other Ambulatory Visit: Payer: Self-pay | Admitting: Medical Genetics

## 2023-12-03 DIAGNOSIS — Z006 Encounter for examination for normal comparison and control in clinical research program: Secondary | ICD-10-CM

## 2023-12-10 ENCOUNTER — Other Ambulatory Visit: Payer: Self-pay

## 2023-12-10 DIAGNOSIS — F5101 Primary insomnia: Secondary | ICD-10-CM

## 2023-12-10 NOTE — Telephone Encounter (Unsigned)
 Copied from CRM [id number]. Topic: Clinical - Medication Refill >> Dec 10, 2023  9:44 AM Nathanel BROCKS wrote: Medication: zolpidem  (AMBIEN ) 5 MG tablet  Has the patient contacted their pharmacy? No  This is the patient's preferred pharmacy:   [id number] [place], [place] - [address] [facility] [address] [place] [address] Phone: [phone] Fax: [phone]  Is this the correct pharmacy for this prescription? Yes If no, delete pharmacy and type the correct one.   Has the prescription been filled recently? Yes  Is the patient out of the medication? Yes  Has the patient been seen for an appointment in the last year OR does the patient have an upcoming appointment? Yes  Can we respond through MyChart? Yes  Agent: Please be advised that Rx refills may take up to 3 business days. We ask that you follow-up with your pharmacy.

## 2023-12-11 MED ORDER — ZOLPIDEM TARTRATE 5 MG PO TABS: 5.0000 mg | ORAL_TABLET | Freq: Every evening | ORAL | 0 refills | Status: DC | PRN

## 2023-12-12 ENCOUNTER — Other Ambulatory Visit: Payer: Self-pay | Admitting: Internal Medicine

## 2023-12-13 ENCOUNTER — Encounter (HOSPITAL_COMMUNITY): Payer: Self-pay

## 2023-12-13 ENCOUNTER — Ambulatory Visit (HOSPITAL_COMMUNITY)
Admission: RE | Admit: 2023-12-13 | Discharge: 2023-12-13 | Disposition: A | Discharge status: Home / Self Care | Source: Ambulatory Visit

## 2023-12-13 DIAGNOSIS — Z1231 Encounter for screening mammogram for malignant neoplasm of breast: Secondary | ICD-10-CM | POA: Insufficient documentation

## 2023-12-18 ENCOUNTER — Ambulatory Visit (INDEPENDENT_AMBULATORY_CARE_PROVIDER_SITE_OTHER)

## 2023-12-18 VITALS — BP 138/89 | HR 89 | Ht 63.0 in | Wt 178.4 lb

## 2023-12-18 DIAGNOSIS — Z0000 Encounter for general adult medical examination without abnormal findings: Secondary | ICD-10-CM

## 2023-12-18 NOTE — Progress Notes (Addendum)
 Subjective:   Cristina Harris is a 58 y.o. female who presents for a Medicare Annual Wellness Visit.  Allergies (verified) Patient has no known allergies.   History: Past Medical History:  Diagnosis Date   Asthma    Carpal tunnel syndrome on both sides    Past Surgical History:  Procedure Laterality Date   CARPAL TUNNEL RELEASE Right 2022   COLONOSCOPY WITH PROPOFOL  N/A 02/14/2022   Procedure: COLONOSCOPY WITH PROPOFOL ;  Surgeon: Cindie Carlin POUR, DO;  [facility]: [facility];  Service: Endoscopy;  Laterality: N/A;  1:30 pm, pt knows to arrive at 7:00   NASAL FRACTURE SURGERY     POLYPECTOMY  02/14/2022   Procedure: POLYPECTOMY;  Surgeon: Cindie Carlin POUR, DO;  Location: [facility];  Service: Endoscopy;;   TUBAL LIGATION     WRIST ARTHROSCOPY Right 2022   Family History  Problem Relation Age of Onset   Breast cancer Mother    Arthritis Mother    COPD Mother    Heart failure Father    Diabetes type II Father    Heart failure Brother    Asthma Brother    Lupus Daughter    ADD / ADHD Daughter    Diabetes type II Maternal Grandmother    Diabetes Maternal Grandmother    Depression Daughter    Obesity Daughter    Colon cancer Neg Hx    Cervical cancer Neg Hx    Social History   Occupational History   Not on file  Tobacco Use   Smoking status: Former    Current packs/day: 2.00    Average packs/day: 2.0 packs/day for 42.9 years (85.7 ttl pk-yrs)    Types: Cigarettes    Start date: 1983    Passive exposure: Past   Smokeless tobacco: Never   Tobacco comments:    Started smoking at age 17, quit smoking 2023.   Vaping Use   Vaping status: Former  Substance and Sexual Activity   Alcohol use: Yes    Comment: occ   Drug use: No   Sexual activity: Not Currently    Partners: Male    Birth control/protection: Surgical   Tobacco Counseling Counseling given: Yes Tobacco comments: Started smoking at age 42, quit smoking 2023.   SDOH Screenings   Food  Insecurity: No Food Insecurity (12/18/2023)  Housing: Low Risk  (12/18/2023)  Transportation Needs: No Transportation Needs (12/18/2023)  Utilities: Not At Risk (12/18/2023)  Alcohol Screen: Low Risk  (10/09/2023)  Depression (PHQ2-9): Medium Risk (12/18/2023)  Financial Resource Strain: Low Risk  (10/09/2023)  Physical Activity: Inactive (12/18/2023)  Social Connections: Socially Isolated (12/18/2023)  Stress: No Stress Concern Present (12/18/2023)  Tobacco Use: Medium Risk (12/18/2023)  Health Literacy: Adequate Health Literacy (12/18/2023)   Depression Screen    12/18/2023    8:40 AM 07/12/2023    8:18 AM 01/09/2023    8:29 AM 10/10/2022    8:13 AM 07/13/2022    2:08 PM 06/30/2022    8:13 AM 05/05/2022    8:10 AM  PHQ 2/9 Scores  PHQ - 2 Score 4 2 0 1 0 1 1  PHQ- 9 Score 5 9   6  5  5  5       Data saved with a previous flowsheet row definition     Goals Addressed               This Visit's Progress     Remain active (pt-stated)  Visit info / Clinical Intake: Medicare Wellness Visit Type:: Initial Annual Wellness Visit Persons participating in visit:: patient Medicare Wellness Visit Mode:: Video Because this visit was a virtual/telehealth visit:: pt reported vitals If Telephone or Video please confirm:: I connected with the patient using audio enabled telemedicine application and verified that I am speaking with the correct person using two identifiers; I discussed the limitations of evaluation and management by telemedicine; The patient expressed understanding and agreed to proceed Patient Location:: home Provider Location:: [facility] Information given by:: patient Interpreter Needed?: No Pre-visit prep was completed: yes AWV questionnaire completed by patient prior to visit?: no Living arrangements:: (!) lives alone Patient's Overall Health Status Rating: very good Typical amount of pain: none Does pain affect daily life?: no Are you currently  prescribed opioids?: no  Dietary Habits and Nutritional Risks How many meals a day?: 3 Eats fruit and vegetables daily?: yes Most meals are obtained by: preparing own meals In the last 2 weeks, have you had any of the following?: none Diabetic:: (!) yes Any non-healing wounds?: no How often do you check your BS?: 0 Would you like to be referred to a Nutritionist or for Diabetic Management? : no  Functional Status Activities of Daily Living (to include ambulation/medication): Independent Ambulation: Independent Medication Administration: Independent Home Management: Independent Manage your own finances?: yes Primary transportation is: driving Concerns about vision?: no *vision screening is required for WTM* Concerns about hearing?: no  Fall Screening Falls in the past year?: 0 Number of falls in past year: 0 Was there an injury with Fall?: 0 Fall Risk Category Calculator: 0 Patient Fall Risk Level: Low Fall Risk  Fall Risk Patient at Risk for Falls Due to: No Fall Risks Fall risk Follow up: Falls prevention discussed; Education provided; Falls evaluation completed  Home and Transportation Safety: All rugs have non-skid backing?: N/A, no rugs All stairs or steps have railings?: N/A, no stairs Grab bars in the bathtub or shower?: yes Have non-skid surface in bathtub or shower?: yes Good home lighting?: yes Regular seat belt use?: yes Hospital stays in the last year:: no  Cognitive Assessment Difficulty concentrating, remembering, or making decisions? : no Will 6CIT or Mini Cog be Completed: no 6CIT or Mini Cog Declined: patient alert, oriented, able to answer questions appropriately and recall recent events  Advance Directives (For Healthcare) Does Patient Have a Medical Advance Directive?: No Would patient like information on creating a medical advance directive?: No - Patient declined  Reviewed/Updated  Reviewed/Updated: Reviewed All (Medical, Surgical, Family,  Medications, Allergies, Care Teams, Patient Goals)        Objective:    Today's Vitals   12/18/23 0820  BP: 138/89  Pulse: 89  Weight: 178 lb 6.4 oz (80.9 kg)  Height: 5' 3 (1.6 m)   Body mass index is 31.6 kg/m.  Current Medications (verified) Outpatient Encounter Medications as of 12/18/2023  Medication Sig   albuterol  (VENTOLIN  HFA) 108 (90 Base) MCG/ACT inhaler INHALE 1 PUFF INTO THE LUNGS AS NEEDED FOR WHEEZING OR SHORTNESS OF BREATH.   amLODipine  (NORVASC ) 5 MG tablet TAKE 1 TABLET (5 MG TOTAL) BY MOUTH DAILY.   atorvastatin  (LIPITOR) 40 MG tablet TAKE 1 TABLET BY MOUTH EVERY DAY   Cholecalciferol (VITAMIN D3) 25 MCG (1000 UT) CAPS Take 1 capsule (1,000 Units total) by mouth daily.   ibuprofen  (ADVIL ) 800 MG tablet TAKE 1 TABLET (800 MG TOTAL) BY MOUTH DAILY AS NEEDED (HEADACHE).   semaglutide -weight management (WEGOVY ) 1.7 MG/0.75ML  SOAJ SQ injection Inject 1.7 mg into the skin once a week.   zolpidem  (AMBIEN ) 5 MG tablet Take 1 tablet (5 mg total) by mouth at bedtime as needed for sleep.   No facility-administered encounter medications on file as of 12/18/2023.   Hearing/Vision screen Hearing Screening - Comments:: Patient denies any hearing difficulties.   Vision Screening - Comments:: Wears rx glasses - up to date with routine eye exams with   Immunizations and Health Maintenance Health Maintenance  Topic Date Due   Pneumococcal Vaccine: 50+ Years (1 of 2 - PCV) Never done   Hepatitis B Vaccines 19-59 Average Risk (1 of 3 - 19+ 3-dose series) Never done   Lung Cancer Screening  Never done   Zoster Vaccines- Shingrix (1 of 2) Never done   Influenza Vaccine  Never done   Fecal DNA (Cologuard)  10/25/2024   Mammogram  12/12/2024   Medicare Annual Wellness (AWV)  12/17/2024   Cervical Cancer Screening (HPV/Pap Cotest)  05/05/2027   DTaP/Tdap/Td (3 - Td or Tdap) 12/23/2028   Colonoscopy  02/15/2032   Hepatitis C Screening  Completed   HIV Screening  Completed    HPV VACCINES  Aged Out   Meningococcal B Vaccine  Aged Out   COVID-19 Vaccine  Discontinued        Assessment/Plan:  This is a routine wellness examination for Cristina Harris.  Patient Care Team: Bevely Doffing, FNP as PCP - General (Family Medicine) Cesario Boer, MD as Attending Physician (Physical Medicine and Rehabilitation) Vicci Mcardle, [place] (Optometry) Mardi Kathryne LABOR, NP as Nurse Practitioner (Behavioral Health) Cindie Carlin POUR, DO as Consulting Physician (Gastroenterology)  I have personally reviewed and noted the following in the patient's chart:   Medical and social history Use of alcohol, tobacco or illicit drugs  Current medications and supplements including opioid prescriptions. Functional ability and status Nutritional status Physical activity Advanced directives List of other physicians Hospitalizations, surgeries, and ER visits in previous 12 months Vitals Screenings to include cognitive, depression, and falls Referrals and appointments  No orders of the defined types were placed in this encounter.  In addition, I have reviewed and discussed with patient certain preventive protocols, quality metrics, and best practice recommendations. A written personalized care plan for preventive services as well as general preventive health recommendations were provided to patient.   Chrishana Spargur, CMA   12/18/2023   Return on December 19, 2024 at 8:00am for you Medicare Wellness Visit.  After Visit Summary: (MyChart) Due to this being a telephonic visit, the after visit summary with patients personalized plan was offered to patient via MyChart   Nurse Notes:

## 2023-12-18 NOTE — Patient Instructions (Signed)
 Ms. Fei,  Thank you for taking the time for your Medicare Wellness Visit. I appreciate your continued commitment to your health goals. Please review the care plan we discussed, and feel free to reach out if I can assist you further.  Please note that Annual Wellness Visits do not include a physical exam. Some assessments may be limited, especially if the visit was conducted virtually. If needed, we may recommend an in-person follow-up with your provider.  Ongoing Care Seeing your primary care provider every 3 to 6 months helps us  monitor your health and provide consistent, personalized care.   Referrals If a referral was made during today's visit and you haven't received any updates within two weeks, please contact the referred provider directly to check on the status.  Recommended Screenings:  Health Maintenance  Topic Date Due   Pneumococcal Vaccine for age over 74 (1 of 2 - PCV) Never done   Hepatitis B Vaccine (1 of 3 - 19+ 3-dose series) Never done   Zoster (Shingles) Vaccine (1 of 2) Never done   Flu Shot  Never done   Cologuard (Stool DNA test)  10/25/2024   Breast Cancer Screening  12/12/2024   Medicare Annual Wellness Visit  12/17/2024   Pap with HPV screening  05/05/2027   DTaP/Tdap/Td vaccine (3 - Td or Tdap) 12/23/2028   Colon Cancer Screening  02/15/2032   Hepatitis C Screening  Completed   HIV Screening  Completed   HPV Vaccine  Aged Out   Meningitis B Vaccine  Aged Out   COVID-19 Vaccine  Discontinued       12/18/2023    8:37 AM  Advanced Directives  Does Patient Have a Medical Advance Directive? No  Would patient like information on creating a medical advance directive? No - Patient declined    Vision: Annual vision screenings are recommended for early detection of glaucoma, cataracts, and diabetic retinopathy. These exams can also reveal signs of chronic conditions such as diabetes and high blood pressure.  Dental: Annual dental screenings help detect  early signs of oral cancer, gum disease, and other conditions linked to overall health, including heart disease and diabetes.  Please see the attached documents for additional preventive care recommendations.

## 2023-12-19 ENCOUNTER — Ambulatory Visit: Payer: Self-pay

## 2023-12-21 ENCOUNTER — Other Ambulatory Visit (HOSPITAL_COMMUNITY): Payer: Self-pay

## 2023-12-24 ENCOUNTER — Other Ambulatory Visit (HOSPITAL_COMMUNITY): Payer: Self-pay

## 2024-01-08 ENCOUNTER — Other Ambulatory Visit: Payer: Self-pay

## 2024-01-08 DIAGNOSIS — J45909 Unspecified asthma, uncomplicated: Secondary | ICD-10-CM

## 2024-02-06 ENCOUNTER — Other Ambulatory Visit: Payer: Self-pay

## 2024-02-06 DIAGNOSIS — F5101 Primary insomnia: Secondary | ICD-10-CM

## 2024-02-06 MED ORDER — ZOLPIDEM TARTRATE 5 MG PO TABS: 5.0000 mg | ORAL_TABLET | Freq: Every evening | ORAL | 0 refills | Status: AC | PRN

## 2024-02-06 MED ORDER — IBUPROFEN 800 MG PO TABS: 800.0000 mg | ORAL_TABLET | Freq: Every day | ORAL | 1 refills | Status: AC | PRN

## 2024-02-06 NOTE — Telephone Encounter (Signed)
 Copied from CRM #[id number]. Topic: Clinical - Medication Refill >> Feb 06, 2024 11:01 AM Cristina Harris wrote: Medication:  zolpidem  (AMBIEN ) 5 MG tablet ibuprofen  (ADVIL ) 800 MG tablet  Has the patient contacted their pharmacy? Yes, call dr  This is the patient's preferred pharmacy:  [id number] - [place], [place] - [address] [place] [address] Phone: [phone] Fax: [phone]  Is this the correct pharmacy for this prescription? Yes If no, delete pharmacy and type the correct one.   Has the prescription been filled recently? No  Is the patient out of the medication? No  Has the patient been seen for an appointment in the last year OR does the patient have an upcoming appointment? Yes  Can we respond through MyChart? Yes  Agent: Please be advised that Rx refills may take up to 3 business days. We ask that you follow-up with your pharmacy.

## 2024-02-11 ENCOUNTER — Ambulatory Visit

## 2024-02-11 VITALS — BP 130/92 | HR 72 | Ht 64.0 in | Wt 190.0 lb

## 2024-02-11 DIAGNOSIS — I10 Essential (primary) hypertension: Secondary | ICD-10-CM | POA: Diagnosis not present

## 2024-02-11 DIAGNOSIS — F5101 Primary insomnia: Secondary | ICD-10-CM

## 2024-02-11 NOTE — Progress Notes (Signed)
 "  Established Patient Office Visit  Subjective   Patient ID: Cristina Harris, female    DOB: 1966/01/08  Age: 59 y.o. MRN: [id number]  Chief Complaint  Patient presents with   Medical Management of Chronic Issues    Pt here for a follow up    HPI Discussed the use of AI scribe software for clinical note transcription with the patient, who gave verbal consent to proceed.  History of Present Illness    Cristina Harris is a 59 year old female who presents for a refill of Ambien  and to discuss her blood pressure management.  Insomnia - Uses zolpidem  (Ambien ) as needed for sleep - Did not take zolpidem  last night as she had just received her refill yesterday  Hypertension - Elevated blood pressure attributed to missing medication dose yesterday  Psychological stress and bereavement - Significant emotional distress following the death of her brother in 2024/11/24 - Brother was 29 years old; she had raised him and sent him to college - Attends therapy sessions every Friday to cope with loss  Unintentional weight loss - Experienced significant weight loss, previously reaching 178 pounds, which she felt was too low for her body image - Plans to return to the gym to regain weight - Current wardrobe fits but feels a bit tight     Patient Active Problem List   Diagnosis Date Noted   Primary insomnia 07/12/2023   Hepatic steatosis 07/13/2022   Prediabetes 07/13/2022   Encounter for well adult exam with abnormal findings 07/13/2022   Encounter for Papanicolaou smear of cervix 05/05/2022   Other spondylosis with radiculopathy, lumbar region 05/02/2022   Lower extremity numbness 04/18/2022   Positive colorectal cancer screening using Cologuard test 11/28/2021   Hyperlipidemia 11/28/2021   Problem related to unspecified psychosocial circumstances 11/07/2021   Tobacco use 10/17/2021   Amenorrhea 10/06/2021   Vitamin D  insufficiency 08/02/2021   Preventative health care 07/08/2021    Obesity (BMI 30-39.9) 07/08/2021   Asthma 07/08/2021   Depression, major, single episode, moderate (HCC) 07/08/2021   Benign hypertension 05/25/2020   Bilateral carpal tunnel syndrome 04/21/2020   Degenerative tear of triangular fibrocartilage complex (TFCC) of right wrist 09/26/2019   Pain, wrist, right 09/26/2019    ROS    Objective:     BP (!) 130/92 (BP Location: Left Arm, Patient Position: Sitting, Cuff Size: Normal)   Pulse 72   Ht 5' 4 (1.626 m)   Wt 190 lb (86.2 kg)   LMP 03/06/2013   SpO2 97%   BMI 32.61 kg/m  BP Readings from Last 3 Encounters:  02/11/24 (!) 130/92  12/18/23 138/89  10/12/23 122/80   Wt Readings from Last 3 Encounters:  02/11/24 190 lb (86.2 kg)  12/18/23 178 lb 6.4 oz (80.9 kg)  10/12/23 177 lb 1.3 oz (80.3 kg)     Physical Exam Vitals and nursing note reviewed.  Constitutional:      Appearance: Normal appearance. She is obese.  HENT:     Head: Normocephalic.  Eyes:     Extraocular Movements: Extraocular movements intact.     Pupils: Pupils are equal, round, and reactive to light.  Cardiovascular:     Rate and Rhythm: Normal rate and regular rhythm.  Pulmonary:     Effort: Pulmonary effort is normal.     Breath sounds: Normal breath sounds.  Musculoskeletal:     Cervical back: Normal range of motion and neck supple.  Neurological:     Mental Status: She is alert  and oriented to person, place, and time.  Psychiatric:        Mood and Affect: Mood normal.        Thought Content: Thought content normal.     No results found for any visits on 02/11/24.    The 10-year ASCVD risk score (Arnett DK, et al., 2019) is: 11.8%    Assessment & Plan:   Problem List Items Addressed This Visit       Cardiovascular and Mediastinum   Benign hypertension - Primary   Blood pressure elevated at 130/92 mmHg, likely due to stress and missed medication dose. - Continue current antihypertensive regimen. - Encouraged adherence to medication  schedule. - Advised stress management techniques.        Other   Primary insomnia   Managed with zolpidem . Recent refill obtained, but medication was not taken last night. - Continue zolpidem  as prescribed. - Ensure medication adherence to improve sleep quality.       Return in about 6 months (around 08/10/2024) for chronic follow-up with PCP.    Leita Longs, FNP  "

## 2024-02-12 NOTE — Assessment & Plan Note (Signed)
 Blood pressure elevated at 130/92 mmHg, likely due to stress and missed medication dose. - Continue current antihypertensive regimen. - Encouraged adherence to medication schedule. - Advised stress management techniques.

## 2024-02-12 NOTE — Assessment & Plan Note (Signed)
 Managed with zolpidem . Recent refill obtained, but medication was not taken last night. - Continue zolpidem  as prescribed. - Ensure medication adherence to improve sleep quality.

## 2024-03-07 ENCOUNTER — Other Ambulatory Visit: Payer: Self-pay

## 2024-03-07 DIAGNOSIS — Z1231 Encounter for screening mammogram for malignant neoplasm of breast: Secondary | ICD-10-CM

## 2024-03-14 ENCOUNTER — Other Ambulatory Visit: Payer: Self-pay

## 2024-03-14 DIAGNOSIS — F5101 Primary insomnia: Secondary | ICD-10-CM

## 2024-08-11 ENCOUNTER — Ambulatory Visit: Payer: Self-pay

## 2024-12-15 ENCOUNTER — Ambulatory Visit

## 2024-12-19 ENCOUNTER — Ambulatory Visit
